# Patient Record
Sex: Female | Born: 1990 | Race: White | Hispanic: No | Marital: Single | Smoking: Never smoker
Health system: Southern US, Community
[De-identification: ages and names within clinical notes are randomized; demographics above are authoritative.]

## PROBLEM LIST (undated history)

## (undated) DIAGNOSIS — D649 Anemia, unspecified: Secondary | ICD-10-CM

## (undated) HISTORY — PX: WISDOM TOOTH EXTRACTION: SHX21

---

## 2018-01-11 ENCOUNTER — Encounter (HOSPITAL_COMMUNITY): Payer: Self-pay

## 2018-01-11 ENCOUNTER — Emergency Department (HOSPITAL_COMMUNITY)
Admission: EM | Admit: 2018-01-11 | Discharge: 2018-01-11 | Disposition: A | Discharge status: Home / Self Care | Payer: Self-pay | Attending: Emergency Medicine | Admitting: Emergency Medicine

## 2018-01-11 ENCOUNTER — Other Ambulatory Visit: Payer: Self-pay

## 2018-01-11 DIAGNOSIS — R51 Headache: Secondary | ICD-10-CM | POA: Insufficient documentation

## 2018-01-11 DIAGNOSIS — A084 Viral intestinal infection, unspecified: Secondary | ICD-10-CM | POA: Insufficient documentation

## 2018-01-11 LAB — CBC
HEMATOCRIT: 41.9 % (ref 36.0–46.0)
Hemoglobin: 13.8 g/dL (ref 12.0–15.0)
MCH: 29.3 pg (ref 26.0–34.0)
MCHC: 32.9 g/dL (ref 30.0–36.0)
MCV: 89 fL (ref 78.0–100.0)
PLATELETS: 218 10*3/uL (ref 150–400)
RBC: 4.71 MIL/uL (ref 3.87–5.11)
RDW: 13 % (ref 11.5–15.5)
WBC: 7.4 10*3/uL (ref 4.0–10.5)

## 2018-01-11 LAB — URINALYSIS, ROUTINE W REFLEX MICROSCOPIC
Bilirubin Urine: NEGATIVE
Glucose, UA: NEGATIVE mg/dL
Ketones, ur: 80 mg/dL — AB
Leukocytes, UA: NEGATIVE
NITRITE: NEGATIVE
PH: 5 (ref 5.0–8.0)
Protein, ur: 100 mg/dL — AB
RBC / HPF: >50 RBC/hpf — ABNORMAL HIGH (ref 0–5)
SPECIFIC GRAVITY, URINE: 1.029 (ref 1.005–1.030)

## 2018-01-11 LAB — COMPREHENSIVE METABOLIC PANEL
ALBUMIN: 4.3 g/dL (ref 3.5–5.0)
ALT: 27 U/L (ref 14–54)
AST: 28 U/L (ref 15–41)
Alkaline Phosphatase: 83 U/L (ref 38–126)
Anion gap: 11 (ref 5–15)
BILIRUBIN TOTAL: 0.7 mg/dL (ref 0.3–1.2)
BUN: 11 mg/dL (ref 6–20)
CALCIUM: 9.2 mg/dL (ref 8.9–10.3)
CO2: 24 mmol/L (ref 22–32)
CREATININE: 0.83 mg/dL (ref 0.44–1.00)
Chloride: 101 mmol/L (ref 101–111)
GFR calc Af Amer: >60 mL/min (ref >60)
GFR calc non Af Amer: >60 mL/min (ref >60)
GLUCOSE: 93 mg/dL (ref 65–99)
Potassium: 3.9 mmol/L (ref 3.5–5.1)
Sodium: 136 mmol/L (ref 135–145)
TOTAL PROTEIN: 7.9 g/dL (ref 6.5–8.1)

## 2018-01-11 LAB — LIPASE, BLOOD: Lipase: 26 U/L (ref 11–51)

## 2018-01-11 LAB — I-STAT BETA HCG BLOOD, ED (MC, WL, AP ONLY): I-stat hCG, quantitative: <5 m[IU]/mL (ref <5)

## 2018-01-11 MED ORDER — ONDANSETRON HCL 4 MG PO TABS: 4.0000 mg | ORAL_TABLET | Freq: Four times a day (QID) | ORAL | 0 refills | Status: DC

## 2018-01-11 MED ORDER — SODIUM CHLORIDE 0.9 % IV BOLUS
1000.0000 mL | Freq: Once | INTRAVENOUS | Status: AC
  Administered 2018-01-11: 1000 mL via INTRAVENOUS

## 2018-01-11 MED ORDER — ACETAMINOPHEN 325 MG PO TABS
650.0000 mg | ORAL_TABLET | Freq: Once | ORAL | Status: AC
Start: 2018-01-11 — End: 2018-01-11
  Administered 2018-01-11: 650 mg via ORAL
  Filled 2018-01-11: qty 2

## 2018-01-11 MED ORDER — ONDANSETRON HCL 4 MG/2ML IJ SOLN
4.0000 mg | Freq: Once | INTRAMUSCULAR | Status: AC
  Administered 2018-01-11: 4 mg via INTRAVENOUS
  Filled 2018-01-11: qty 2

## 2018-01-11 MED ORDER — ONDANSETRON 4 MG PO TBDP: 4.0000 mg | ORAL_TABLET | Freq: Once | ORAL | Status: DC | PRN

## 2018-01-11 NOTE — ED Triage Notes (Signed)
Pt states she has been vomiting for 2 days, denies abdominal pain. Has implant and denies pregnancy. Pt reports some headache as well.

## 2018-01-11 NOTE — ED Provider Notes (Addendum)
[facility] EMERGENCY DEPARTMENT Provider Note   CSN: [id number] Arrival date & time: 01/11/18  1149     History   Chief Complaint Chief Complaint  Patient presents with  . Emesis    HPI Carrie Cooper is a 27 y.o. female.  HPI   27 year old female with no significant past medical history.  Presents today with complaints of nausea vomiting.  Patient notes symptoms started yesterday with several episodes of nonbloody vomit.  She notes she had one bowel movement yesterday that was very loose, no hematochezia.  Patient notes no associated abdominal pain, cough, fever, rashes, dysuria, vaginal discharge.  She notes she is currently on her menstrual cycle.  Patient denies any close sick contacts, she notes she did find a tick on her upon wakening 2 days ago, she notes this was likely on less than 12 hours, no rash.  Patient does note a minor associated headache, nonfocal, denies any neurological deficits.  She denies any neck stiffness.  History reviewed. No pertinent past medical history.  There are no active problems to display for this patient.   History reviewed. No pertinent surgical history.   OB History   None      Home Medications    Prior to Admission medications   Medication Sig Start Date End Date Taking? Authorizing Provider  ondansetron (ZOFRAN) 4 MG tablet Take 1 tablet (4 mg total) by mouth every 6 (six) hours. 01/11/18   Eyvonne Mechanic, PA-C    Family History History reviewed. No pertinent family history.  Social History Social History   Tobacco Use  . Smoking status: Never Smoker  . Smokeless tobacco: Never Used  Substance Use Topics  . Alcohol use: Never    Frequency: Never  . Drug use: Never     Allergies   Patient has no known allergies.   Review of Systems Review of Systems  All other systems reviewed and are negative.    Physical Exam Updated Vital Signs BP 101/75 (BP Location: Right Arm)   Pulse 88   Temp (!)  100.6 F (38.1 C) (Oral)   Resp 14   SpO2 100%   Physical Exam  Constitutional: She is oriented to person, place, and time. She appears well-developed and well-nourished.  HENT:  Head: Normocephalic and atraumatic.  Eyes: Pupils are equal, round, and reactive to light. Conjunctivae are normal. Right eye exhibits no discharge. Left eye exhibits no discharge. No scleral icterus.  Neck: Normal range of motion. No JVD present. No tracheal deviation present.  Pulmonary/Chest: Effort normal and breath sounds normal. No stridor. No respiratory distress. She has no wheezes. She has no rales. She exhibits no tenderness.  Abdominal: Soft. She exhibits no distension and no mass. There is no tenderness. There is no rebound and no guarding. No hernia.  Neurological: She is alert and oriented to person, place, and time. Coordination normal.  Skin:  No rashes - minor irritation to left gluteal fold no erythema   Psychiatric: She has a normal mood and affect. Her behavior is normal. Judgment and thought content normal.  Nursing note and vitals reviewed.    ED Treatments / Results  Labs (all labs ordered are listed, but only abnormal results are displayed) Labs Reviewed  URINALYSIS, ROUTINE W REFLEX MICROSCOPIC - Abnormal; Notable for the following components:      Result Value   APPearance CLOUDY (*)    Hgb urine dipstick LARGE (*)    Ketones, ur 80 (*)    Protein,  ur 100 (*)    RBC / HPF >50 (*)    Bacteria, UA RARE (*)    All other components within normal limits  LIPASE, BLOOD  COMPREHENSIVE METABOLIC PANEL  CBC  I-STAT BETA HCG BLOOD, ED ([facility], [facility], [facility] ONLY)    EKG None  Radiology No results found.  Procedures Procedures (including critical care time)  Medications Ordered in ED Medications  ondansetron (ZOFRAN-ODT) disintegrating tablet 4 mg (has no administration in time range)  sodium chloride 0.9 % bolus 1,000 mL (1,000 mLs Intravenous New Bag/Given 01/11/18 1653)  ondansetron  (ZOFRAN) injection 4 mg (4 mg Intravenous Given 01/11/18 1653)  acetaminophen (TYLENOL) tablet 650 mg (650 mg Oral Given 01/11/18 1652)     Initial Impression / Assessment and Plan / ED Course  I have reviewed the triage vital signs and the nursing notes.  Pertinent labs & imaging results that were available during my care of the patient were reviewed by me and considered in my medical decision making (see chart for details).      Final Clinical Impressions(s) / ED Diagnoses   Final diagnoses:  Viral gastroenteritis    Labs: Urinalysis, i-STAT beta-hCG, lipase, CMP, CBC  Imaging:  Consults:  Therapeutics: Drenda Freeze, normal saline  Discharge Meds: Zofran  Assessment/Plan: 27 year old female presents today with likely viral gastroenteritis.  Patient well-appearing in no acute distress, soft nontender abdomen.  She originally had a generalized headache that resolved with fluids and Tylenol, no signs of significant infectious etiology including meningitis, appendicitis, diverticulitis or any intra-abdominal pathology.  Patient tolerating p.o. fever resolved with Tylenol.  She discharged home with Zofran, she will return in 2 days if symptoms persist immediately if they worsen.  She verbalized understanding and agreement to this plan had no further questions or concerns at time of discharge.   ED Discharge Orders        Ordered    ondansetron (ZOFRAN) 4 MG tablet  Every 6 hours     01/11/18 1831          Eyvonne Mechanic, PA-C 01/11/18 1833    Tegeler, Canary Brim, MD 01/11/18 [phone]

## 2018-01-11 NOTE — Discharge Instructions (Addendum)
Please read attached information. If you experience any new or worsening signs or symptoms please return to the emergency room for evaluation. Please follow-up with your primary care provider or specialist as discussed. Please use medication prescribed only as directed and discontinue taking if you have any concerning signs or symptoms.   °

## 2018-09-07 NOTE — L&D Delivery Note (Signed)
Delivery Note  At 1:32 PM a viable female, named Carrie Cooper, was delivered via Vaginal, Spontaneous (Presentation: LOA ).  APGAR: 8, 9; weight pending Placenta status:spontaneous, complete with 3 Vx Cord.  2 loose nuchal cord loops easily reduced  Anesthesia:  epidural Episiotomy: None Lacerations: Vaginal Suture Repair: 3.0 Monocryl Est. Blood Loss (mL): 424  Mom to postpartum.  Baby to Couplet care / Skin to Skin. Breastfeeding Outpatient circumcision  Dede Query Hawk Mones 07/03/2019, 2:31 PM

## 2018-12-13 DIAGNOSIS — R87610 Atypical squamous cells of undetermined significance on cytologic smear of cervix (ASC-US): Secondary | ICD-10-CM | POA: Diagnosis not present

## 2018-12-13 DIAGNOSIS — Z3A11 11 weeks gestation of pregnancy: Secondary | ICD-10-CM | POA: Diagnosis not present

## 2018-12-13 DIAGNOSIS — N925 Other specified irregular menstruation: Secondary | ICD-10-CM | POA: Diagnosis not present

## 2018-12-13 DIAGNOSIS — Z124 Encounter for screening for malignant neoplasm of cervix: Secondary | ICD-10-CM | POA: Diagnosis not present

## 2018-12-13 DIAGNOSIS — O3680X9 Pregnancy with inconclusive fetal viability, other fetus: Secondary | ICD-10-CM | POA: Diagnosis not present

## 2018-12-13 DIAGNOSIS — R112 Nausea with vomiting, unspecified: Secondary | ICD-10-CM | POA: Diagnosis not present

## 2019-01-24 DIAGNOSIS — Z3482 Encounter for supervision of other normal pregnancy, second trimester: Secondary | ICD-10-CM | POA: Diagnosis not present

## 2019-02-14 DIAGNOSIS — Z363 Encounter for antenatal screening for malformations: Secondary | ICD-10-CM | POA: Diagnosis not present

## 2019-02-14 DIAGNOSIS — Z3A20 20 weeks gestation of pregnancy: Secondary | ICD-10-CM | POA: Diagnosis not present

## 2019-02-14 DIAGNOSIS — Z3402 Encounter for supervision of normal first pregnancy, second trimester: Secondary | ICD-10-CM | POA: Diagnosis not present

## 2019-03-14 DIAGNOSIS — R252 Cramp and spasm: Secondary | ICD-10-CM | POA: Diagnosis not present

## 2019-03-14 DIAGNOSIS — Z3492 Encounter for supervision of normal pregnancy, unspecified, second trimester: Secondary | ICD-10-CM | POA: Diagnosis not present

## 2019-03-14 DIAGNOSIS — L292 Pruritus vulvae: Secondary | ICD-10-CM | POA: Diagnosis not present

## 2019-03-14 DIAGNOSIS — Z3A24 24 weeks gestation of pregnancy: Secondary | ICD-10-CM | POA: Diagnosis not present

## 2019-05-10 DIAGNOSIS — K219 Gastro-esophageal reflux disease without esophagitis: Secondary | ICD-10-CM | POA: Diagnosis not present

## 2019-05-10 DIAGNOSIS — Z23 Encounter for immunization: Secondary | ICD-10-CM | POA: Diagnosis not present

## 2019-05-10 DIAGNOSIS — Z3493 Encounter for supervision of normal pregnancy, unspecified, third trimester: Secondary | ICD-10-CM | POA: Diagnosis not present

## 2019-05-10 DIAGNOSIS — Z3A32 32 weeks gestation of pregnancy: Secondary | ICD-10-CM | POA: Diagnosis not present

## 2019-05-10 DIAGNOSIS — O26849 Uterine size-date discrepancy, unspecified trimester: Secondary | ICD-10-CM | POA: Diagnosis not present

## 2019-06-08 DIAGNOSIS — Z3A37 37 weeks gestation of pregnancy: Secondary | ICD-10-CM | POA: Diagnosis not present

## 2019-06-08 DIAGNOSIS — O365999 Maternal care for other known or suspected poor fetal growth, unspecified trimester, other fetus: Secondary | ICD-10-CM | POA: Diagnosis not present

## 2019-06-08 DIAGNOSIS — Z113 Encounter for screening for infections with a predominantly sexual mode of transmission: Secondary | ICD-10-CM | POA: Diagnosis not present

## 2019-06-08 DIAGNOSIS — Z3403 Encounter for supervision of normal first pregnancy, third trimester: Secondary | ICD-10-CM | POA: Diagnosis not present

## 2019-06-08 LAB — OB RESULTS CONSOLE GBS: GBS: NEGATIVE

## 2019-06-30 ENCOUNTER — Telehealth (HOSPITAL_COMMUNITY): Payer: Self-pay | Admitting: *Deleted

## 2019-06-30 NOTE — Telephone Encounter (Signed)
Preadmission screen  

## 2019-07-03 ENCOUNTER — Encounter (HOSPITAL_COMMUNITY): Payer: Self-pay

## 2019-07-03 ENCOUNTER — Inpatient Hospital Stay (HOSPITAL_COMMUNITY): Payer: Medicaid Other | Admitting: Anesthesiology

## 2019-07-03 ENCOUNTER — Inpatient Hospital Stay (HOSPITAL_COMMUNITY)
Admission: AD | Admit: 2019-07-03 | Discharge: 2019-07-04 | DRG: 807 | Disposition: A | Discharge status: Home / Self Care | Payer: Medicaid Other | Attending: Obstetrics and Gynecology | Admitting: Obstetrics and Gynecology

## 2019-07-03 ENCOUNTER — Other Ambulatory Visit: Payer: Self-pay

## 2019-07-03 DIAGNOSIS — E669 Obesity, unspecified: Secondary | ICD-10-CM | POA: Diagnosis present

## 2019-07-03 DIAGNOSIS — O9902 Anemia complicating childbirth: Secondary | ICD-10-CM | POA: Diagnosis not present

## 2019-07-03 DIAGNOSIS — O99214 Obesity complicating childbirth: Secondary | ICD-10-CM | POA: Diagnosis present

## 2019-07-03 DIAGNOSIS — Z3A40 40 weeks gestation of pregnancy: Secondary | ICD-10-CM | POA: Diagnosis not present

## 2019-07-03 DIAGNOSIS — Z20828 Contact with and (suspected) exposure to other viral communicable diseases: Secondary | ICD-10-CM | POA: Diagnosis present

## 2019-07-03 DIAGNOSIS — D649 Anemia, unspecified: Secondary | ICD-10-CM | POA: Diagnosis not present

## 2019-07-03 DIAGNOSIS — O26893 Other specified pregnancy related conditions, third trimester: Secondary | ICD-10-CM | POA: Diagnosis present

## 2019-07-03 HISTORY — DX: Anemia, unspecified: D64.9

## 2019-07-03 LAB — CBC
HCT: 31.6 % — ABNORMAL LOW (ref 36.0–46.0)
Hemoglobin: 10.1 g/dL — ABNORMAL LOW (ref 12.0–15.0)
MCH: 27.6 pg (ref 26.0–34.0)
MCHC: 32 g/dL (ref 30.0–36.0)
MCV: 86.3 fL (ref 80.0–100.0)
Platelets: 279 10*3/uL (ref 150–400)
RBC: 3.66 MIL/uL — ABNORMAL LOW (ref 3.87–5.11)
RDW: 13.9 % (ref 11.5–15.5)
WBC: 8.7 10*3/uL (ref 4.0–10.5)
nRBC: 0 % (ref 0.0–0.2)

## 2019-07-03 LAB — TYPE AND SCREEN
ABO/RH(D): A POS
Antibody Screen: NEGATIVE

## 2019-07-03 LAB — RPR: RPR Ser Ql: NONREACTIVE

## 2019-07-03 LAB — SARS CORONAVIRUS 2 BY RT PCR (HOSPITAL ORDER, PERFORMED IN ~~LOC~~ HOSPITAL LAB): SARS Coronavirus 2: NEGATIVE

## 2019-07-03 LAB — ABO/RH: ABO/RH(D): A POS

## 2019-07-03 MED ORDER — WITCH HAZEL-GLYCERIN EX PADS: 1.0000 "application " | MEDICATED_PAD | CUTANEOUS | Status: DC | PRN

## 2019-07-03 MED ORDER — SOD CITRATE-CITRIC ACID 500-334 MG/5ML PO SOLN: 30.0000 mL | ORAL | Status: DC | PRN

## 2019-07-03 MED ORDER — OXYTOCIN 40 UNITS IN NORMAL SALINE INFUSION - SIMPLE MED
2.5000 [IU]/h | INTRAVENOUS | Status: DC
  Administered 2019-07-03: 2.5 [IU]/h via INTRAVENOUS
  Filled 2019-07-03: qty 1000

## 2019-07-03 MED ORDER — SENNOSIDES-DOCUSATE SODIUM 8.6-50 MG PO TABS
2.0000 | ORAL_TABLET | ORAL | Status: DC
  Administered 2019-07-04: 01:00:00 2 via ORAL
  Filled 2019-07-03: qty 2

## 2019-07-03 MED ORDER — PRENATAL MULTIVITAMIN CH
1.0000 | ORAL_TABLET | Freq: Every day | ORAL | Status: DC
  Administered 2019-07-04: 11:00:00 1 via ORAL
  Filled 2019-07-03: qty 1

## 2019-07-03 MED ORDER — ONDANSETRON HCL 4 MG/2ML IJ SOLN: 4.0000 mg | INTRAMUSCULAR | Status: DC | PRN

## 2019-07-03 MED ORDER — ONDANSETRON HCL 4 MG/2ML IJ SOLN: 4.0000 mg | Freq: Four times a day (QID) | INTRAMUSCULAR | Status: DC | PRN

## 2019-07-03 MED ORDER — LACTATED RINGERS IV SOLN: 500.0000 mL | Freq: Once | INTRAVENOUS | Status: DC

## 2019-07-03 MED ORDER — LACTATED RINGERS IV SOLN: 500.0000 mL | INTRAVENOUS | Status: DC | PRN

## 2019-07-03 MED ORDER — PHENYLEPHRINE 40 MCG/ML (10ML) SYRINGE FOR IV PUSH (FOR BLOOD PRESSURE SUPPORT)
80.0000 ug | PREFILLED_SYRINGE | INTRAVENOUS | Status: DC | PRN
  Filled 2019-07-03: qty 10

## 2019-07-03 MED ORDER — ACETAMINOPHEN 325 MG PO TABS: 650.0000 mg | ORAL_TABLET | ORAL | Status: DC | PRN

## 2019-07-03 MED ORDER — LACTATED RINGERS IV SOLN
500.0000 mL | Freq: Once | INTRAVENOUS | Status: AC
  Administered 2019-07-03: 500 mL via INTRAVENOUS

## 2019-07-03 MED ORDER — EPHEDRINE 5 MG/ML INJ: 10.0000 mg | INTRAVENOUS | Status: DC | PRN

## 2019-07-03 MED ORDER — ONDANSETRON HCL 4 MG PO TABS: 4.0000 mg | ORAL_TABLET | ORAL | Status: DC | PRN

## 2019-07-03 MED ORDER — FENTANYL CITRATE (PF) 100 MCG/2ML IJ SOLN: 50.0000 ug | INTRAMUSCULAR | Status: DC | PRN

## 2019-07-03 MED ORDER — COCONUT OIL OIL
1.0000 "application " | TOPICAL_OIL | Status: DC | PRN
  Administered 2019-07-04: 1 via TOPICAL

## 2019-07-03 MED ORDER — BENZOCAINE-MENTHOL 20-0.5 % EX AERO: 1.0000 "application " | INHALATION_SPRAY | CUTANEOUS | Status: DC | PRN

## 2019-07-03 MED ORDER — LACTATED RINGERS IV SOLN
INTRAVENOUS | Status: DC
  Administered 2019-07-03 (×2): via INTRAVENOUS

## 2019-07-03 MED ORDER — DIPHENHYDRAMINE HCL 25 MG PO CAPS: 25.0000 mg | ORAL_CAPSULE | Freq: Four times a day (QID) | ORAL | Status: DC | PRN

## 2019-07-03 MED ORDER — FENTANYL-BUPIVACAINE-NACL 0.5-0.125-0.9 MG/250ML-% EP SOLN
12.0000 mL/h | EPIDURAL | Status: DC | PRN
  Filled 2019-07-03: qty 250

## 2019-07-03 MED ORDER — LIDOCAINE HCL (PF) 1 % IJ SOLN
INTRAMUSCULAR | Status: DC | PRN
  Administered 2019-07-03 (×2): 4 mL via EPIDURAL

## 2019-07-03 MED ORDER — SODIUM CHLORIDE (PF) 0.9 % IJ SOLN
INTRAMUSCULAR | Status: DC | PRN
  Administered 2019-07-03: 12 mL/h via EPIDURAL

## 2019-07-03 MED ORDER — ZOLPIDEM TARTRATE 5 MG PO TABS: 5.0000 mg | ORAL_TABLET | Freq: Every evening | ORAL | Status: DC | PRN

## 2019-07-03 MED ORDER — TETANUS-DIPHTH-ACELL PERTUSSIS 5-2.5-18.5 LF-MCG/0.5 IM SUSP: 0.5000 mL | Freq: Once | INTRAMUSCULAR | Status: DC

## 2019-07-03 MED ORDER — DIPHENHYDRAMINE HCL 50 MG/ML IJ SOLN: 12.5000 mg | INTRAMUSCULAR | Status: DC | PRN

## 2019-07-03 MED ORDER — OXYTOCIN BOLUS FROM INFUSION
500.0000 mL | Freq: Once | INTRAVENOUS | Status: AC
  Administered 2019-07-03: 14:00:00 500 mL via INTRAVENOUS

## 2019-07-03 MED ORDER — DIBUCAINE (PERIANAL) 1 % EX OINT: 1.0000 "application " | TOPICAL_OINTMENT | CUTANEOUS | Status: DC | PRN

## 2019-07-03 MED ORDER — IBUPROFEN 600 MG PO TABS
600.0000 mg | ORAL_TABLET | Freq: Four times a day (QID) | ORAL | Status: DC
  Administered 2019-07-03 – 2019-07-04 (×5): 600 mg via ORAL
  Filled 2019-07-03 (×5): qty 1

## 2019-07-03 MED ORDER — SIMETHICONE 80 MG PO CHEW: 80.0000 mg | CHEWABLE_TABLET | ORAL | Status: DC | PRN

## 2019-07-03 MED ORDER — LIDOCAINE HCL (PF) 1 % IJ SOLN: 30.0000 mL | INTRAMUSCULAR | Status: DC | PRN

## 2019-07-03 MED ORDER — PHENYLEPHRINE 40 MCG/ML (10ML) SYRINGE FOR IV PUSH (FOR BLOOD PRESSURE SUPPORT): 80.0000 ug | PREFILLED_SYRINGE | INTRAVENOUS | Status: DC | PRN

## 2019-07-03 MED ORDER — FERROUS SULFATE 325 (65 FE) MG PO TABS
325.0000 mg | ORAL_TABLET | Freq: Two times a day (BID) | ORAL | Status: DC
  Administered 2019-07-03 – 2019-07-04 (×3): 325 mg via ORAL
  Filled 2019-07-03 (×3): qty 1

## 2019-07-03 MED ORDER — MEASLES, MUMPS & RUBELLA VAC IJ SOLR: 0.5000 mL | Freq: Once | INTRAMUSCULAR | Status: DC

## 2019-07-03 NOTE — H&P (Signed)
OB ADMISSION/ HISTORY & PHYSICAL:  Admission Date: 07/03/2019  4:23 AM  Admit Diagnosis: Normal labor  Carrie Cooper is a 28 y.o. female presenting for ctx that began @ 0300. Endorses active FM, denies LOF and vaginal bleeding.   Prenatal History: G1P0   EDC : 06/29/2019, by  LMP and congruent w/ [redacted]w[redacted]d U/S Prenatal care at [facility]  Prenatal course complicated by anemia  Patient Active Problem List   Diagnosis Date Noted  . Normal labor 07/03/2019    Prenatal Labs: ABO, Rh: --/--/A POS (10/26 1610) Antibody: NEG (10/26 0545) Rubella:  Immune  RPR:  Non-reactive HBsAg:  Negative  HIV:   Non-reactive GTT: passed GBS: Negative/-- (10/01 0000)  GC/CHL: negative Genetics: Low-risk female AFP: Normal Anat U/S: Complete U/S @ [redacted]w[redacted]d for lagging FL-Vertex, posterior right placenta, AFI 20.8, EFW 3049g (6#12oz) 52%tile, FL lagging <2%  OB History  Gravida Para Term Preterm AB Living  1            SAB TAB Ectopic Multiple Live Births               # Outcome Date GA Lbr Len/2nd Weight Sex Delivery Anes PTL Lv  1 Current             Medical / Surgical History: Past medical history:  Past Medical History:  Diagnosis Date  . Anemia     Past surgical history:  Past Surgical History:  Procedure Laterality Date  . WISDOM TOOTH EXTRACTION     no sedation due to being done with preg   Family History:  Family History  Problem Relation Age of Onset  . Cancer Mother     Social History:  reports that she has never smoked. She has never used smokeless tobacco. She reports that she does not drink alcohol or use drugs.  Allergies: Patient has no known allergies.   Current Medications at time of admission:  Prior to Admission medications   Medication Sig Start Date End Date Taking? Authorizing Provider  ondansetron (ZOFRAN) 4 MG tablet Take 1 tablet (4 mg total) by mouth every 6 (six) hours. 01/11/18  Yes Hedges, Tinnie Gens, PA-C    Review of Systems: Constitutional:  Negative.   HENT: Negative.   Eyes: Negative.   Respiratory: Negative.   Cardiovascular: Negative.   Gastrointestinal:   Genitourinary: nEGATIVE for bloody show negative for ROM   Musculoskeletal: Negative.   Skin: Negative.   Neurological: Negative.   Endo/Heme/Allergies: Negative.   Psychiatric/Behavioral: Negative.    Physical Exam: VS: Blood pressure 118/87, pulse 83, temperature 98.2 F (36.8 C), temperature source Axillary, resp. rate 18, height 5\' 4"  (1.626 m), weight 83 kg, last menstrual period 09/22/2018, SpO2 100 %. AAO x3, no signs of distress Cardiovascular: RRR Respiratory: Lung fields clear with ausculation GU/GI: Abdomen gravid, non-tender, non-distended, active FM, vertex and 7#6oz per Leopold's Extremities: trace edema, negative for pain, tenderness, and cords  Cervical exam:Dilation: 6 Effacement (%): 90 Station: -2 Exam by:: katherine g jones rn FHR: baseline rate 135 / variability moderate / accelerations present / absent decelerations TOCO: 1.5-3/ mod per palpation   Prenatal Transfer Tool  Maternal Diabetes: No Genetic Screening: Normal Maternal Ultrasounds/Referrals: Other: Fetal Ultrasounds or other Referrals:  None Maternal Substance Abuse:  No Significant Maternal Medications:  None Significant Maternal Lab Results: Group B Strep negative    Assessment: 28 y.o. G1P0 [redacted]w[redacted]d Active labor FHR category 1 GBS negative Anemia     -PO Iron during preg     -  Hgb 10.1 on admission Pain management: Desires unmedicated childbirth   Plan:  Admit to L&D Routine admission orders Epidural PRN Active management of third stage to reduce PP bleeding  Dr Mancel Bale and Dr. Cletis Media notified of admission and plan of care  Arrie Eastern CNM, MSN 07/03/2019 8:00 AM

## 2019-07-03 NOTE — Progress Notes (Signed)
Derica Leiber is a 28 y.o. G1P0 at 9w4dadmitted for active labor  Subjective:  Comfortable with  Epidural Contractions every 2-4 minutes, lasting 40 seconds, palpating moderate    Objective: BP 126/87   Pulse 72   Temp 97.6 F (36.4 C) (Axillary)   Resp 18   Ht 5\' 4"  (1.626 m)   Wt 83 kg   LMP 09/22/2018   SpO2 98%   BMI 31.41 kg/m  No intake/output data recorded. No intake/output data recorded.  FHT: Category 1 SVE:   Dilation: 7 Effacement (%): 90 Station: -1 Exam by:: Dr. Cletis Media  Labs: Lab Results  Component Value Date   WBC 8.7 07/03/2019   HGB 10.1 (L) 07/03/2019   HCT 31.6 (L) 07/03/2019   MCV 86.3 07/03/2019   PLT 279 07/03/2019    Assessment / Plan: Spontaneous labor, progressing normally Fetal Wellbeing: reassuring Anticipated MOD:  NSVD  Katharine Look A Dane Bloch 07/03/2019, 11:03 AM

## 2019-07-03 NOTE — Lactation Note (Signed)
This note was copied from a baby's chart. Lactation Consultation Note  Patient Name: Carrie Cooper Today's Date: 07/03/2019 Reason for consult: Initial assessment;1st time breastfeeding;Term P1, 7 hour female infant. Mom feels breastfeeding is going well, she latched infant in L&D for 15 minutes and when entered another 15 minutes. This will be mom's third time breastfeeding infant. [place] notice mom had coconut oil on counter but not used it was given by Nurse. Infant had two stools and one void since birth, LC changed stool (meconium) while in room. Mom is active on the [facility] in [facility] and she has two hand pumps at home. LC entered room infant was cuing to breastfeed. LC discussed hand expression and mom taught back, colostrum is present in both breast. Mom latched infant on right breast using the cross cradle hold, LC ask mom express small amount colostrum out, tickle infant below nose with breast, wait until infant's mouth is wide, tongue down bring infant chin first to breast. Infant latched without difficulty, nose and chin touching breast, swallows observed, infant breastfeed for 26 minutes. Mom did STS with infant afterwards. Mom felt latch has improved only felt a tug but no pain with latch. Mom knows to breastfeed infant according hunger cues, 8 to 12 times within 24 hours and on demand. Parents will continue to do STS. Mom knows to call Nurse or [place] if she has any questions, concerns or need assistance with latching infant to breast. Mom made aware of O/P services, breastfeeding support groups, community resources, and our phone # for post-discharge questions.   Maternal Data Formula Feeding for Exclusion: No Has patient been taught Hand Expression?: Yes(Colostrum is present both breast.) Does the patient have breastfeeding experience prior to this delivery?: No  Feeding Feeding Type: Breast Fed  LATCH Score Latch: Grasps breast easily, tongue down, lips  flanged, rhythmical sucking.  Audible Swallowing: Spontaneous and intermittent  Type of Nipple: Everted at rest and after stimulation  Comfort (Breast/Nipple): Soft / non-tender  Hold (Positioning): Assistance needed to correctly position infant at breast and maintain latch.  LATCH Score: 9  Interventions Interventions: Breast feeding basics reviewed;Assisted with latch;Skin to skin;Breast massage;Hand express;Coconut oil;Expressed milk;Support pillows;Adjust position;Breast compression;Position options  Lactation Tools Discussed/Used WIC Program: Yes   Consult Status Consult Status: Follow-up Date: 07/04/19 Follow-up type: In-patient    Vicente Serene 07/03/2019, 8:54 PM

## 2019-07-03 NOTE — Anesthesia Procedure Notes (Signed)
Epidural Patient location during procedure: OB Start time: 07/03/2019 8:15 AM End time: 07/03/2019 8:23 AM  Staffing Anesthesiologist: Josephine Igo, MD Performed: anesthesiologist   Preanesthetic Checklist Completed: patient identified, site marked, surgical consent, pre-op evaluation, timeout performed, IV checked, risks and benefits discussed and monitors and equipment checked  Epidural Patient position: sitting Prep: site prepped and draped and DuraPrep Patient monitoring: continuous pulse ox and blood pressure Approach: midline Location: L3-L4 Injection technique: LOR air  Needle:  Needle type: Tuohy  Needle gauge: 17 G Needle length: 9 cm and 9 Needle insertion depth: 6 cm Catheter type: closed end flexible Catheter size: 19 Gauge Catheter at skin depth: 11 cm Test dose: negative and Other  Assessment Events: blood not aspirated, injection not painful, no injection resistance, negative IV test and no paresthesia  Additional Notes Patient identified. Risks and benefits discussed including failed block, incomplete  Pain control, post dural puncture headache, nerve damage, paralysis, blood pressure Changes, nausea, vomiting, reactions to medications-both toxic and allergic and post Partum back pain. All questions were answered. Patient expressed understanding and wished to proceed. Sterile technique was used throughout procedure. Epidural site was Dressed with sterile barrier dressing. No paresthesias, signs of intravascular injection Or signs of intrathecal spread were encountered.  Patient was more comfortable after the epidural was dosed. Please see RN's note for documentation of vital signs and FHR which are stable. Reason for block:procedure for pain

## 2019-07-03 NOTE — Anesthesia Preprocedure Evaluation (Signed)
Anesthesia Evaluation  Patient identified by MRN, date of birth, ID band Patient awake    Reviewed: Allergy & Precautions, H&P , Patient's Chart, lab work & pertinent test results  Airway Mallampati: II  TM Distance: >3 FB Neck ROM: full    Dental no notable dental hx. (+) Teeth Intact   Pulmonary neg pulmonary ROS,    Pulmonary exam normal breath sounds clear to auscultation       Cardiovascular negative cardio ROS Normal cardiovascular exam Rhythm:regular Rate:Normal     Neuro/Psych negative neurological ROS  negative psych ROS   GI/Hepatic negative GI ROS, Neg liver ROS,   Endo/Other  Obesity  Renal/GU negative Renal ROS  negative genitourinary   Musculoskeletal   Abdominal   Peds  Hematology  (+) Blood dyscrasia, anemia ,   Anesthesia Other Findings   Reproductive/Obstetrics (+) Pregnancy                             Anesthesia Physical Anesthesia Plan  ASA: II  Anesthesia Plan: Epidural   Post-op Pain Management:    Induction:   PONV Risk Score and Plan:   Airway Management Planned:   Additional Equipment:   Intra-op Plan:   Post-operative Plan:   Informed Consent: I have reviewed the patients History and Physical, chart, labs and discussed the procedure including the risks, benefits and alternatives for the proposed anesthesia with the patient or authorized representative who has indicated his/her understanding and acceptance.       Plan Discussed with: Anesthesiologist  Anesthesia Plan Comments:         Anesthesia Quick Evaluation  

## 2019-07-04 LAB — CBC
HCT: 29 % — ABNORMAL LOW (ref 36.0–46.0)
Hemoglobin: 9.4 g/dL — ABNORMAL LOW (ref 12.0–15.0)
MCH: 28.1 pg (ref 26.0–34.0)
MCHC: 32.4 g/dL (ref 30.0–36.0)
MCV: 86.6 fL (ref 80.0–100.0)
Platelets: 268 10*3/uL (ref 150–400)
RBC: 3.35 MIL/uL — ABNORMAL LOW (ref 3.87–5.11)
RDW: 14.2 % (ref 11.5–15.5)
WBC: 10.3 10*3/uL (ref 4.0–10.5)
nRBC: 0 % (ref 0.0–0.2)

## 2019-07-04 MED ORDER — IBUPROFEN 600 MG PO TABS: 600.0000 mg | ORAL_TABLET | Freq: Four times a day (QID) | ORAL | 0 refills | Status: DC

## 2019-07-04 NOTE — Lactation Note (Signed)
This note was copied from a baby's chart. Lactation Consultation Note:  Infant is 30 hours old. Mother plans discharge at 24 hours. Infant has had several short 5 mins feedings. Mother reports that she is unsure if infant is getting enough to eat and has questions about how she will know if infant is feeding well.  Mother reports that her nipples are tender.  Observed that mother has tiny blisters and very pink nipple's bilaterally.  Infant cuing and offered to assist with feeding. Mother very willing for assistance.   Infant placed STS in football hold with good pillow support.  Reviewed hand expression and observed tiny drops of colostrum.  Assist with latching infant on the left breast . Mother reports small amt of pain. Taught FOB to flange infants lips for wider gape.   Infant sustained latch for 15 mins. Observed steady rhythm of suckling and audible swallows. Parents very excited to hear swallowing. Mother taught to do breast compression .  Infant was then placed on the alternate breast in cross cradle hold. Infant sustained latch for 20 mins. Mothers nipple round when infant released the breast.  Reviewed cue base feeding and to feed infant 8-12 or more times in 24 hours.,Discussed cluster feeding and encouraged to continue STS when at home.  Discussed amts. of wet/dirty diapers and transitioning colors.   Mother was given a harmony hand pump. She is active with WIC. She doesn't have an electric pump at home. Mother fit with the #24 flange.  Mother was given comfort gels. She doesn't have a bra at the hospital but taught to use .   Mother would like to have a follow up with [place] LC. Informed mother of virtual support group and 24 /7 phone services.   Patient Name: Carrie Cooper Date: 07/04/2019 Reason for consult: Follow-up assessment   Maternal Data    Feeding Feeding Type: Breast Fed  LATCH Score Latch: Grasps breast easily, tongue down, lips flanged,  rhythmical sucking.  Audible Swallowing: Spontaneous and intermittent  Type of Nipple: Everted at rest and after stimulation  Comfort (Breast/Nipple): Filling, red/small blisters or bruises, mild/mod discomfort  Hold (Positioning): Assistance needed to correctly position infant at breast and maintain latch.  LATCH Score: 8  Interventions Interventions: Breast feeding basics reviewed;Assisted with latch;Skin to skin;Breast massage;Hand express;Pre-pump if needed;Breast compression;Support pillows;Position options;Comfort gels;Hand pump  Lactation Tools Discussed/Used     Consult Status Consult Status: Complete    Darla Lesches 07/04/2019, 11:27 AM

## 2019-07-04 NOTE — Discharge Summary (Signed)
OB Discharge Summary     Patient Name: Carrie Cooper DOB: [birth year] MRN: [id number]  Date of admission: 07/03/2019 Delivering MD: Silverio Lay   Date of discharge: 07/04/2019  Admitting diagnosis: 40.4WKS CTX Intrauterine pregnancy: [redacted]w[redacted]d     Secondary diagnosis:  Active Problems:   Normal labor   Anemia  Additional problems: None     Discharge diagnosis: Term Pregnancy Delivered                                                                                                Post partum procedures:None  Augmentation: None  Complications: None  Hospital course:  Onset of Labor With Vaginal Delivery     28 y.o. yo G1P1001 at [redacted]w[redacted]d was admitted in Active Labor on 07/03/2019. Patient had an uncomplicated labor course as follows:  Membrane Rupture Time/Date: 10:47 AM ,07/03/2019   Intrapartum Procedures: Episiotomy: None [1]                                         Lacerations:  Vaginal [6]  Patient had a delivery of a Viable infant. 07/03/2019  Information for the patient's newborn:  Burlene, [place] [[id number]]  Delivery Method: Vag-Spont     Pateint had an uncomplicated postpartum course.  She is ambulating, tolerating a regular diet, passing flatus, and urinating well. Patient is discharged home in stable condition on 07/04/19.   Physical exam  Vitals:   07/03/19 1630 07/03/19 1957 07/04/19 0038 07/04/19 0412  BP: 121/78 124/85 121/83 (!) 135/93  Pulse: 84 81 84 73  Resp: 18 18 18 18   Temp: 98.2 F (36.8 C) 98.2 F (36.8 C) 98.2 F (36.8 C) 98.3 F (36.8 C)  TempSrc: Oral Oral Oral Oral  SpO2: 98% 98% 97% 98%  Weight:      Height:       General: alert, cooperative and no distress Lochia: appropriate Uterine Fundus: firm Incision: N/A DVT Evaluation: No evidence of DVT seen on physical exam. Labs: Lab Results  Component Value Date   WBC 8.7 07/03/2019   HGB 10.1 (L) 07/03/2019   HCT 31.6 (L) 07/03/2019   MCV 86.3 07/03/2019   PLT 279  07/03/2019   CMP Latest Ref Rng & Units 01/11/2018  Glucose 65 - 99 mg/dL 93  BUN 6 - 20 mg/dL 11  Creatinine 03/13/2018 - 1.50 mg/dL 5.69  Sodium 7.94 - 801 mmol/L 136  Potassium 3.5 - 5.1 mmol/L 3.9  Chloride 101 - 111 mmol/L 101  CO2 22 - 32 mmol/L 24  Calcium 8.9 - 10.3 mg/dL 9.2  Total Protein 6.5 - 8.1 g/dL 7.9  Total Bilirubin 0.3 - 1.2 mg/dL 0.7  Alkaline Phos 38 - 126 U/L 83  AST 15 - 41 U/L 28  ALT 14 - 54 U/L 27    Discharge instruction: per After Visit Summary and "Baby and Me Booklet".  After visit meds:  Ibuprofen, PNV  Diet: routine diet  Activity: Advance as tolerated. Pelvic rest for 6 weeks.  Outpatient follow up:6 weeks Follow up Appt:No future appointments. Follow up Visit:No follow-ups on file.  Postpartum contraception: Depo Provera  Newborn Data: Live born female  Birth Weight: 7 lb 15.2 oz (3605 g) APGAR: 8, 9  Newborn Delivery   Birth date/time: 07/03/2019 13:32:00 Delivery type: Vaginal, Spontaneous      Baby Feeding: Breast Disposition:home with mother   07/04/2019 Starla Link, CNM

## 2019-07-04 NOTE — Anesthesia Postprocedure Evaluation (Signed)
Anesthesia Post Note  Patient: Carrie Cooper  Procedure(s) Performed: AN AD HOC LABOR EPIDURAL     Patient location during evaluation: Mother Baby Anesthesia Type: Epidural Level of consciousness: awake and alert Pain management: pain level controlled Vital Signs Assessment: post-procedure vital signs reviewed and stable Respiratory status: spontaneous breathing, nonlabored ventilation and respiratory function stable Cardiovascular status: stable Postop Assessment: no headache, no backache and epidural receding Anesthetic complications: no    Last Vitals:  Vitals:   07/04/19 0038 07/04/19 0412  BP: 121/83 (!) 135/93  Pulse: 84 73  Resp: 18 18  Temp: 36.8 C 36.8 C  SpO2: 97% 98%    Last Pain:  Vitals:   07/04/19 0412  TempSrc: Oral  PainSc:    Pain Goal:                   Bionca Mckey

## 2019-07-06 ENCOUNTER — Inpatient Hospital Stay (HOSPITAL_COMMUNITY)
Admission: AD | Admit: 2019-07-06 | Payer: Medicaid Other | Source: Home / Self Care | Admitting: Obstetrics & Gynecology

## 2019-07-06 ENCOUNTER — Inpatient Hospital Stay (HOSPITAL_COMMUNITY): Payer: Medicaid Other

## 2019-08-14 DIAGNOSIS — N898 Other specified noninflammatory disorders of vagina: Secondary | ICD-10-CM | POA: Diagnosis not present

## 2019-08-14 DIAGNOSIS — N760 Acute vaginitis: Secondary | ICD-10-CM | POA: Diagnosis not present

## 2019-08-14 DIAGNOSIS — L02419 Cutaneous abscess of limb, unspecified: Secondary | ICD-10-CM | POA: Diagnosis not present

## 2020-02-29 DIAGNOSIS — R05 Cough: Secondary | ICD-10-CM | POA: Diagnosis not present

## 2020-02-29 DIAGNOSIS — Z6825 Body mass index (BMI) 25.0-25.9, adult: Secondary | ICD-10-CM | POA: Diagnosis not present

## 2020-02-29 DIAGNOSIS — Z20828 Contact with and (suspected) exposure to other viral communicable diseases: Secondary | ICD-10-CM | POA: Diagnosis not present

## 2020-02-29 DIAGNOSIS — U071 COVID-19: Secondary | ICD-10-CM | POA: Diagnosis not present

## 2020-03-25 DIAGNOSIS — Z3A10 10 weeks gestation of pregnancy: Secondary | ICD-10-CM | POA: Diagnosis not present

## 2020-03-25 DIAGNOSIS — O3680X9 Pregnancy with inconclusive fetal viability, other fetus: Secondary | ICD-10-CM | POA: Diagnosis not present

## 2020-03-26 DIAGNOSIS — Z369 Encounter for antenatal screening, unspecified: Secondary | ICD-10-CM | POA: Diagnosis not present

## 2020-03-26 DIAGNOSIS — Z3493 Encounter for supervision of normal pregnancy, unspecified, third trimester: Secondary | ICD-10-CM | POA: Diagnosis not present

## 2020-03-26 DIAGNOSIS — Z315 Encounter for genetic counseling: Secondary | ICD-10-CM | POA: Diagnosis not present

## 2020-03-26 DIAGNOSIS — Z113 Encounter for screening for infections with a predominantly sexual mode of transmission: Secondary | ICD-10-CM | POA: Diagnosis not present

## 2020-03-26 DIAGNOSIS — Z3481 Encounter for supervision of other normal pregnancy, first trimester: Secondary | ICD-10-CM | POA: Diagnosis not present

## 2020-04-01 DIAGNOSIS — Z3493 Encounter for supervision of normal pregnancy, unspecified, third trimester: Secondary | ICD-10-CM | POA: Diagnosis not present

## 2020-04-01 LAB — OB RESULTS CONSOLE HEPATITIS B SURFACE ANTIGEN: Hepatitis B Surface Ag: NEGATIVE

## 2020-04-01 LAB — OB RESULTS CONSOLE RPR: RPR: NONREACTIVE

## 2020-04-01 LAB — OB RESULTS CONSOLE RUBELLA ANTIBODY, IGM: Rubella: IMMUNE

## 2020-05-20 ENCOUNTER — Other Ambulatory Visit: Payer: Self-pay | Admitting: Obstetrics and Gynecology

## 2020-05-20 DIAGNOSIS — Z3689 Encounter for other specified antenatal screening: Secondary | ICD-10-CM

## 2020-05-30 ENCOUNTER — Encounter: Payer: Self-pay | Admitting: *Deleted

## 2020-06-03 ENCOUNTER — Ambulatory Visit: Payer: Medicaid Other

## 2020-06-04 DIAGNOSIS — E860 Dehydration: Secondary | ICD-10-CM | POA: Diagnosis not present

## 2020-06-04 DIAGNOSIS — R110 Nausea: Secondary | ICD-10-CM | POA: Diagnosis not present

## 2020-06-13 ENCOUNTER — Other Ambulatory Visit: Payer: Self-pay | Admitting: Obstetrics and Gynecology

## 2020-06-13 ENCOUNTER — Ambulatory Visit: Payer: Medicaid Other | Attending: Obstetrics and Gynecology

## 2020-06-13 ENCOUNTER — Other Ambulatory Visit: Payer: Self-pay | Admitting: *Deleted

## 2020-06-13 ENCOUNTER — Other Ambulatory Visit: Payer: Self-pay

## 2020-06-13 DIAGNOSIS — Z362 Encounter for other antenatal screening follow-up: Secondary | ICD-10-CM

## 2020-06-13 DIAGNOSIS — Z3689 Encounter for other specified antenatal screening: Secondary | ICD-10-CM | POA: Insufficient documentation

## 2020-06-13 DIAGNOSIS — O30009 Twin pregnancy, unspecified number of placenta and unspecified number of amniotic sacs, unspecified trimester: Secondary | ICD-10-CM | POA: Diagnosis not present

## 2020-07-10 DIAGNOSIS — O358XX0 Maternal care for other (suspected) fetal abnormality and damage, not applicable or unspecified: Secondary | ICD-10-CM | POA: Diagnosis not present

## 2020-07-10 DIAGNOSIS — Z3A25 25 weeks gestation of pregnancy: Secondary | ICD-10-CM | POA: Diagnosis not present

## 2020-07-10 DIAGNOSIS — O09899 Supervision of other high risk pregnancies, unspecified trimester: Secondary | ICD-10-CM | POA: Diagnosis not present

## 2020-07-11 ENCOUNTER — Other Ambulatory Visit: Payer: Self-pay

## 2020-07-11 ENCOUNTER — Encounter: Payer: Self-pay | Admitting: *Deleted

## 2020-07-11 ENCOUNTER — Ambulatory Visit: Payer: Medicaid Other | Admitting: *Deleted

## 2020-07-11 ENCOUNTER — Other Ambulatory Visit: Payer: Self-pay | Admitting: *Deleted

## 2020-07-11 ENCOUNTER — Ambulatory Visit: Payer: Medicaid Other | Attending: Obstetrics and Gynecology

## 2020-07-11 VITALS — BP 125/71 | HR 104

## 2020-07-11 DIAGNOSIS — Z3A25 25 weeks gestation of pregnancy: Secondary | ICD-10-CM

## 2020-07-11 DIAGNOSIS — O09899 Supervision of other high risk pregnancies, unspecified trimester: Secondary | ICD-10-CM

## 2020-07-11 DIAGNOSIS — Z362 Encounter for other antenatal screening follow-up: Secondary | ICD-10-CM

## 2020-07-11 DIAGNOSIS — O09892 Supervision of other high risk pregnancies, second trimester: Secondary | ICD-10-CM | POA: Diagnosis not present

## 2020-07-11 DIAGNOSIS — O43192 Other malformation of placenta, second trimester: Secondary | ICD-10-CM | POA: Diagnosis not present

## 2020-07-23 DIAGNOSIS — R5383 Other fatigue: Secondary | ICD-10-CM | POA: Diagnosis not present

## 2020-07-23 DIAGNOSIS — Z369 Encounter for antenatal screening, unspecified: Secondary | ICD-10-CM | POA: Diagnosis not present

## 2020-08-15 ENCOUNTER — Encounter: Payer: Self-pay | Admitting: *Deleted

## 2020-08-15 ENCOUNTER — Other Ambulatory Visit: Payer: Self-pay | Admitting: *Deleted

## 2020-08-15 ENCOUNTER — Other Ambulatory Visit: Payer: Self-pay

## 2020-08-15 ENCOUNTER — Ambulatory Visit: Payer: Medicaid Other | Admitting: *Deleted

## 2020-08-15 ENCOUNTER — Ambulatory Visit: Payer: Medicaid Other | Attending: Obstetrics and Gynecology

## 2020-08-15 VITALS — BP 117/68 | HR 101

## 2020-08-15 DIAGNOSIS — Z3A30 30 weeks gestation of pregnancy: Secondary | ICD-10-CM

## 2020-08-15 DIAGNOSIS — O09892 Supervision of other high risk pregnancies, second trimester: Secondary | ICD-10-CM

## 2020-08-15 DIAGNOSIS — O09893 Supervision of other high risk pregnancies, third trimester: Secondary | ICD-10-CM

## 2020-08-15 DIAGNOSIS — O283 Abnormal ultrasonic finding on antenatal screening of mother: Secondary | ICD-10-CM

## 2020-08-15 DIAGNOSIS — Z362 Encounter for other antenatal screening follow-up: Secondary | ICD-10-CM | POA: Diagnosis not present

## 2020-08-19 DIAGNOSIS — Z3493 Encounter for supervision of normal pregnancy, unspecified, third trimester: Secondary | ICD-10-CM | POA: Diagnosis not present

## 2020-08-19 DIAGNOSIS — Z3A31 31 weeks gestation of pregnancy: Secondary | ICD-10-CM | POA: Diagnosis not present

## 2020-08-19 DIAGNOSIS — Z23 Encounter for immunization: Secondary | ICD-10-CM | POA: Diagnosis not present

## 2020-08-29 ENCOUNTER — Ambulatory Visit: Payer: Medicaid Other | Attending: Obstetrics and Gynecology | Admitting: *Deleted

## 2020-08-29 ENCOUNTER — Ambulatory Visit: Payer: Medicaid Other | Admitting: *Deleted

## 2020-08-29 ENCOUNTER — Other Ambulatory Visit: Payer: Self-pay

## 2020-08-29 ENCOUNTER — Encounter: Payer: Self-pay | Admitting: *Deleted

## 2020-08-29 VITALS — BP 121/72 | HR 110

## 2020-08-29 DIAGNOSIS — Z3A32 32 weeks gestation of pregnancy: Secondary | ICD-10-CM | POA: Insufficient documentation

## 2020-08-29 DIAGNOSIS — O09899 Supervision of other high risk pregnancies, unspecified trimester: Secondary | ICD-10-CM

## 2020-08-29 DIAGNOSIS — O409XX0 Polyhydramnios, unspecified trimester, not applicable or unspecified: Secondary | ICD-10-CM | POA: Diagnosis not present

## 2020-08-29 NOTE — Procedures (Signed)
Carrie Cooper 1991-04-06 [redacted]w[redacted]d  Fetus A Non-Stress Test Interpretation for 08/29/20  Indication: Polyhydramnios  Fetal Heart Rate A Mode: External Baseline Rate (A): 145 bpm Variability: Moderate Accelerations: 15 x 15 Decelerations: None Multiple birth?: No  Uterine Activity Mode: Palpation,Toco Contraction Frequency (min): U/I Contraction Duration (sec): 20-30 Contraction Quality: Mild Resting Tone Palpated: Relaxed Resting Time: Adequate  Interpretation (Fetal Testing) Nonstress Test Interpretation: Reactive Overall Impression: Reassuring for gestational age Comments: Reviewed EFM tracing with Dr. Parke Poisson

## 2020-09-04 ENCOUNTER — Other Ambulatory Visit: Payer: Self-pay

## 2020-09-04 ENCOUNTER — Encounter: Payer: Self-pay | Admitting: *Deleted

## 2020-09-04 ENCOUNTER — Ambulatory Visit: Payer: Medicaid Other | Attending: Obstetrics and Gynecology | Admitting: *Deleted

## 2020-09-04 ENCOUNTER — Ambulatory Visit: Payer: Medicaid Other | Admitting: *Deleted

## 2020-09-04 VITALS — BP 110/77 | HR 107

## 2020-09-04 DIAGNOSIS — O403XX0 Polyhydramnios, third trimester, not applicable or unspecified: Secondary | ICD-10-CM | POA: Insufficient documentation

## 2020-09-04 DIAGNOSIS — Z3A33 33 weeks gestation of pregnancy: Secondary | ICD-10-CM | POA: Insufficient documentation

## 2020-09-04 DIAGNOSIS — O09893 Supervision of other high risk pregnancies, third trimester: Secondary | ICD-10-CM

## 2020-09-04 DIAGNOSIS — O409XX0 Polyhydramnios, unspecified trimester, not applicable or unspecified: Secondary | ICD-10-CM

## 2020-09-04 NOTE — Procedures (Signed)
Carrie Cooper [birth year] [redacted]w[redacted]d  Fetus A Non-Stress Test Interpretation for 09/04/20  Indication: Polyhydramnios  Fetal Heart Rate A Mode: External Baseline Rate (A): 140 bpm Variability: Moderate Accelerations: 15 x 15 Decelerations: None Multiple birth?: No  Uterine Activity Mode: Palpation,Toco Contraction Frequency (min): none noted Resting Tone Palpated: Relaxed Resting Time: Adequate  Interpretation (Fetal Testing) Nonstress Test Interpretation: Reactive Comments: Reviewed tracing with Dr. Judeth Cornfield

## 2020-09-07 NOTE — L&D Delivery Note (Addendum)
Delivery Note Labor onset: 10/15/2020  Labor Onset Time:0300 Complete dilation at 8:34 AM  Onset of pushing at 0843 FHR second stage Cat 1 Analgesia/Anesthesia intrapartum: epidural  Guided pushing with maternal urge. Delivery of a viable female at 57. Fetal head delivered in OA position. Nuchal cord N/A. Infant placed on maternal abd, dried, and tactile stim. Spontaneous cry at delivery. Cord double clamped after and cut by FOB. 2 RNs present for birth. Cord blood sample collected    Placenta delivered Carrie Cooper per Carrie Cooper, intact, with 2 VC.  Placenta to pathology. 2VC.  Uterine tone firm @ u/2.  bleeding minimal  No laceration identified.  Anesthesia: epidural Repair: N/A QBL/EBL (mL): 50 Complications: N/A APGAR: APGAR (1 MIN):9    APGAR (5 MINS):  10 APGAR (10 MINS):   Mom to postpartum.  Baby to Couplet care / Skin to Skin.  Carrie Cooper Carrie Kidd MSN, CNM 10/15/2020, 9:08 AM

## 2020-09-12 ENCOUNTER — Other Ambulatory Visit: Payer: Self-pay

## 2020-09-12 ENCOUNTER — Ambulatory Visit: Payer: Medicaid Other | Admitting: *Deleted

## 2020-09-12 ENCOUNTER — Encounter: Payer: Self-pay | Admitting: *Deleted

## 2020-09-12 ENCOUNTER — Ambulatory Visit: Payer: Medicaid Other | Attending: Obstetrics and Gynecology

## 2020-09-12 VITALS — BP 120/71 | HR 102

## 2020-09-12 DIAGNOSIS — O283 Abnormal ultrasonic finding on antenatal screening of mother: Secondary | ICD-10-CM | POA: Diagnosis not present

## 2020-09-12 DIAGNOSIS — O403XX0 Polyhydramnios, third trimester, not applicable or unspecified: Secondary | ICD-10-CM

## 2020-09-12 DIAGNOSIS — O09892 Supervision of other high risk pregnancies, second trimester: Secondary | ICD-10-CM

## 2020-09-12 DIAGNOSIS — Z362 Encounter for other antenatal screening follow-up: Secondary | ICD-10-CM

## 2020-09-12 DIAGNOSIS — Z3A34 34 weeks gestation of pregnancy: Secondary | ICD-10-CM

## 2020-09-13 ENCOUNTER — Other Ambulatory Visit: Payer: Self-pay | Admitting: *Deleted

## 2020-09-13 DIAGNOSIS — O403XX0 Polyhydramnios, third trimester, not applicable or unspecified: Secondary | ICD-10-CM

## 2020-09-17 ENCOUNTER — Ambulatory Visit: Payer: Medicaid Other | Attending: Obstetrics and Gynecology

## 2020-09-17 ENCOUNTER — Encounter: Payer: Self-pay | Admitting: *Deleted

## 2020-09-17 ENCOUNTER — Other Ambulatory Visit: Payer: Self-pay

## 2020-09-17 ENCOUNTER — Ambulatory Visit: Payer: Medicaid Other | Admitting: *Deleted

## 2020-09-17 VITALS — BP 119/75 | HR 100

## 2020-09-17 DIAGNOSIS — O09892 Supervision of other high risk pregnancies, second trimester: Secondary | ICD-10-CM

## 2020-09-17 DIAGNOSIS — O403XX0 Polyhydramnios, third trimester, not applicable or unspecified: Secondary | ICD-10-CM

## 2020-09-17 DIAGNOSIS — Z3A35 35 weeks gestation of pregnancy: Secondary | ICD-10-CM | POA: Diagnosis not present

## 2020-09-17 DIAGNOSIS — Z362 Encounter for other antenatal screening follow-up: Secondary | ICD-10-CM

## 2020-09-17 DIAGNOSIS — O368930 Maternal care for other specified fetal problems, third trimester, not applicable or unspecified: Secondary | ICD-10-CM

## 2020-09-24 ENCOUNTER — Ambulatory Visit: Payer: Medicaid Other | Admitting: *Deleted

## 2020-09-24 ENCOUNTER — Other Ambulatory Visit: Payer: Self-pay

## 2020-09-24 ENCOUNTER — Ambulatory Visit: Payer: Medicaid Other | Attending: Obstetrics and Gynecology

## 2020-09-24 ENCOUNTER — Encounter: Payer: Self-pay | Admitting: *Deleted

## 2020-09-24 VITALS — BP 118/77 | HR 115

## 2020-09-24 DIAGNOSIS — E559 Vitamin D deficiency, unspecified: Secondary | ICD-10-CM | POA: Diagnosis not present

## 2020-09-24 DIAGNOSIS — O43192 Other malformation of placenta, second trimester: Secondary | ICD-10-CM

## 2020-09-24 DIAGNOSIS — Z362 Encounter for other antenatal screening follow-up: Secondary | ICD-10-CM | POA: Diagnosis not present

## 2020-09-24 DIAGNOSIS — Z3403 Encounter for supervision of normal first pregnancy, third trimester: Secondary | ICD-10-CM | POA: Diagnosis not present

## 2020-09-24 DIAGNOSIS — O403XX0 Polyhydramnios, third trimester, not applicable or unspecified: Secondary | ICD-10-CM | POA: Diagnosis not present

## 2020-09-24 DIAGNOSIS — Q270 Congenital absence and hypoplasia of umbilical artery: Secondary | ICD-10-CM | POA: Insufficient documentation

## 2020-09-24 DIAGNOSIS — Z331 Pregnant state, incidental: Secondary | ICD-10-CM | POA: Diagnosis not present

## 2020-09-24 DIAGNOSIS — Z3A36 36 weeks gestation of pregnancy: Secondary | ICD-10-CM | POA: Diagnosis not present

## 2020-09-24 DIAGNOSIS — Z3686 Encounter for antenatal screening for cervical length: Secondary | ICD-10-CM | POA: Diagnosis not present

## 2020-09-30 LAB — OB RESULTS CONSOLE GBS: GBS: NEGATIVE

## 2020-10-01 ENCOUNTER — Other Ambulatory Visit: Payer: Self-pay

## 2020-10-01 ENCOUNTER — Ambulatory Visit: Payer: Medicaid Other | Attending: Obstetrics and Gynecology

## 2020-10-01 ENCOUNTER — Ambulatory Visit: Payer: Medicaid Other | Admitting: *Deleted

## 2020-10-01 ENCOUNTER — Encounter: Payer: Self-pay | Admitting: *Deleted

## 2020-10-01 VITALS — BP 136/74 | HR 105

## 2020-10-01 DIAGNOSIS — Z362 Encounter for other antenatal screening follow-up: Secondary | ICD-10-CM | POA: Diagnosis not present

## 2020-10-01 DIAGNOSIS — Q270 Congenital absence and hypoplasia of umbilical artery: Secondary | ICD-10-CM

## 2020-10-01 DIAGNOSIS — O368930 Maternal care for other specified fetal problems, third trimester, not applicable or unspecified: Secondary | ICD-10-CM | POA: Diagnosis not present

## 2020-10-01 DIAGNOSIS — O403XX0 Polyhydramnios, third trimester, not applicable or unspecified: Secondary | ICD-10-CM | POA: Diagnosis not present

## 2020-10-01 DIAGNOSIS — O09893 Supervision of other high risk pregnancies, third trimester: Secondary | ICD-10-CM | POA: Diagnosis not present

## 2020-10-01 DIAGNOSIS — Z3A37 37 weeks gestation of pregnancy: Secondary | ICD-10-CM | POA: Diagnosis not present

## 2020-10-09 ENCOUNTER — Other Ambulatory Visit: Payer: Self-pay | Admitting: Obstetrics and Gynecology

## 2020-10-10 ENCOUNTER — Telehealth (HOSPITAL_COMMUNITY): Payer: Self-pay | Admitting: *Deleted

## 2020-10-10 ENCOUNTER — Encounter (HOSPITAL_COMMUNITY): Payer: Self-pay | Admitting: *Deleted

## 2020-10-10 NOTE — Telephone Encounter (Signed)
Preadmission screen  

## 2020-10-12 ENCOUNTER — Other Ambulatory Visit (HOSPITAL_COMMUNITY)
Admission: RE | Admit: 2020-10-12 | Discharge: 2020-10-12 | Disposition: A | Discharge status: Home / Self Care | Payer: Medicaid Other | Source: Ambulatory Visit | Attending: Obstetrics and Gynecology | Admitting: Obstetrics and Gynecology

## 2020-10-12 DIAGNOSIS — Z20822 Contact with and (suspected) exposure to covid-19: Secondary | ICD-10-CM | POA: Diagnosis not present

## 2020-10-12 DIAGNOSIS — Z01812 Encounter for preprocedural laboratory examination: Secondary | ICD-10-CM | POA: Diagnosis present

## 2020-10-12 LAB — SARS CORONAVIRUS 2 (TAT 6-24 HRS): SARS Coronavirus 2: NEGATIVE

## 2020-10-15 ENCOUNTER — Inpatient Hospital Stay (HOSPITAL_COMMUNITY): Payer: Medicaid Other

## 2020-10-15 ENCOUNTER — Inpatient Hospital Stay (HOSPITAL_COMMUNITY)
Admission: AD | Admit: 2020-10-15 | Discharge: 2020-10-16 | DRG: 807 | Disposition: A | Discharge status: Home / Self Care | Payer: Medicaid Other | Attending: Obstetrics and Gynecology | Admitting: Obstetrics and Gynecology

## 2020-10-15 ENCOUNTER — Other Ambulatory Visit: Payer: Self-pay

## 2020-10-15 ENCOUNTER — Encounter (HOSPITAL_COMMUNITY): Payer: Self-pay | Admitting: Obstetrics and Gynecology

## 2020-10-15 ENCOUNTER — Inpatient Hospital Stay (HOSPITAL_COMMUNITY): Payer: Medicaid Other | Admitting: Anesthesiology

## 2020-10-15 DIAGNOSIS — D649 Anemia, unspecified: Secondary | ICD-10-CM | POA: Diagnosis not present

## 2020-10-15 DIAGNOSIS — Z3A39 39 weeks gestation of pregnancy: Secondary | ICD-10-CM | POA: Diagnosis not present

## 2020-10-15 DIAGNOSIS — O26893 Other specified pregnancy related conditions, third trimester: Secondary | ICD-10-CM | POA: Diagnosis not present

## 2020-10-15 DIAGNOSIS — Z3A00 Weeks of gestation of pregnancy not specified: Secondary | ICD-10-CM | POA: Diagnosis not present

## 2020-10-15 DIAGNOSIS — O43893 Other placental disorders, third trimester: Secondary | ICD-10-CM | POA: Diagnosis not present

## 2020-10-15 DIAGNOSIS — Z3490 Encounter for supervision of normal pregnancy, unspecified, unspecified trimester: Secondary | ICD-10-CM | POA: Diagnosis present

## 2020-10-15 DIAGNOSIS — E559 Vitamin D deficiency, unspecified: Secondary | ICD-10-CM | POA: Diagnosis not present

## 2020-10-15 DIAGNOSIS — O9902 Anemia complicating childbirth: Secondary | ICD-10-CM | POA: Diagnosis not present

## 2020-10-15 LAB — CBC
HCT: 28.2 % — ABNORMAL LOW (ref 36.0–46.0)
Hemoglobin: 8.7 g/dL — ABNORMAL LOW (ref 12.0–15.0)
MCH: 25.4 pg — ABNORMAL LOW (ref 26.0–34.0)
MCHC: 30.9 g/dL (ref 30.0–36.0)
MCV: 82.2 fL (ref 80.0–100.0)
Platelets: 298 10*3/uL (ref 150–400)
RBC: 3.43 MIL/uL — ABNORMAL LOW (ref 3.87–5.11)
RDW: 14.7 % (ref 11.5–15.5)
WBC: 9.1 10*3/uL (ref 4.0–10.5)
nRBC: 0 % (ref 0.0–0.2)

## 2020-10-15 LAB — TYPE AND SCREEN
ABO/RH(D): A POS
Antibody Screen: NEGATIVE

## 2020-10-15 LAB — RPR: RPR Ser Ql: NONREACTIVE

## 2020-10-15 MED ORDER — ONDANSETRON HCL 4 MG/2ML IJ SOLN: 4.0000 mg | Freq: Four times a day (QID) | INTRAMUSCULAR | Status: DC | PRN

## 2020-10-15 MED ORDER — ONDANSETRON HCL 4 MG PO TABS: 4.0000 mg | ORAL_TABLET | ORAL | Status: DC | PRN

## 2020-10-15 MED ORDER — EPHEDRINE 5 MG/ML INJ: 10.0000 mg | INTRAVENOUS | Status: DC | PRN

## 2020-10-15 MED ORDER — BENZOCAINE-MENTHOL 20-0.5 % EX AERO: 1.0000 "application " | INHALATION_SPRAY | CUTANEOUS | Status: DC | PRN

## 2020-10-15 MED ORDER — LACTATED RINGERS IV SOLN: 500.0000 mL | Freq: Once | INTRAVENOUS | Status: DC

## 2020-10-15 MED ORDER — SIMETHICONE 80 MG PO CHEW: 80.0000 mg | CHEWABLE_TABLET | ORAL | Status: DC | PRN

## 2020-10-15 MED ORDER — LACTATED RINGERS IV SOLN: INTRAVENOUS | Status: DC

## 2020-10-15 MED ORDER — ZOLPIDEM TARTRATE 5 MG PO TABS: 5.0000 mg | ORAL_TABLET | Freq: Every evening | ORAL | Status: DC | PRN

## 2020-10-15 MED ORDER — MISOPROSTOL 25 MCG QUARTER TABLET
25.0000 ug | ORAL_TABLET | ORAL | Status: DC | PRN
  Filled 2020-10-15: qty 1

## 2020-10-15 MED ORDER — DIBUCAINE (PERIANAL) 1 % EX OINT: 1.0000 "application " | TOPICAL_OINTMENT | CUTANEOUS | Status: DC | PRN

## 2020-10-15 MED ORDER — ACETAMINOPHEN 325 MG PO TABS: 650.0000 mg | ORAL_TABLET | ORAL | Status: DC | PRN

## 2020-10-15 MED ORDER — TERBUTALINE SULFATE 1 MG/ML IJ SOLN: 0.2500 mg | Freq: Once | INTRAMUSCULAR | Status: DC | PRN

## 2020-10-15 MED ORDER — OXYTOCIN-SODIUM CHLORIDE 30-0.9 UT/500ML-% IV SOLN
1.0000 m[IU]/min | INTRAVENOUS | Status: DC
  Administered 2020-10-15: 2 m[IU]/min via INTRAVENOUS

## 2020-10-15 MED ORDER — OXYTOCIN-SODIUM CHLORIDE 30-0.9 UT/500ML-% IV SOLN
2.5000 [IU]/h | INTRAVENOUS | Status: DC
  Administered 2020-10-15: 2.5 [IU]/h via INTRAVENOUS
  Filled 2020-10-15: qty 500

## 2020-10-15 MED ORDER — DIPHENHYDRAMINE HCL 50 MG/ML IJ SOLN: 12.5000 mg | INTRAMUSCULAR | Status: DC | PRN

## 2020-10-15 MED ORDER — FENTANYL-BUPIVACAINE-NACL 0.5-0.125-0.9 MG/250ML-% EP SOLN
12.0000 mL/h | EPIDURAL | Status: DC | PRN
  Filled 2020-10-15: qty 250

## 2020-10-15 MED ORDER — FENTANYL CITRATE (PF) 100 MCG/2ML IJ SOLN: 50.0000 ug | INTRAMUSCULAR | Status: DC | PRN

## 2020-10-15 MED ORDER — LIDOCAINE HCL (PF) 1 % IJ SOLN
INTRAMUSCULAR | Status: DC | PRN
  Administered 2020-10-15: 5 mL via EPIDURAL

## 2020-10-15 MED ORDER — PHENYLEPHRINE 40 MCG/ML (10ML) SYRINGE FOR IV PUSH (FOR BLOOD PRESSURE SUPPORT): 80.0000 ug | PREFILLED_SYRINGE | INTRAVENOUS | Status: DC | PRN

## 2020-10-15 MED ORDER — WITCH HAZEL-GLYCERIN EX PADS: 1.0000 "application " | MEDICATED_PAD | CUTANEOUS | Status: DC | PRN

## 2020-10-15 MED ORDER — SOD CITRATE-CITRIC ACID 500-334 MG/5ML PO SOLN: 30.0000 mL | ORAL | Status: DC | PRN

## 2020-10-15 MED ORDER — TETANUS-DIPHTH-ACELL PERTUSSIS 5-2.5-18.5 LF-MCG/0.5 IM SUSY: 0.5000 mL | PREFILLED_SYRINGE | Freq: Once | INTRAMUSCULAR | Status: DC

## 2020-10-15 MED ORDER — OXYCODONE HCL 5 MG PO TABS: 5.0000 mg | ORAL_TABLET | ORAL | Status: DC | PRN

## 2020-10-15 MED ORDER — FENTANYL-BUPIVACAINE-NACL 0.5-0.125-0.9 MG/250ML-% EP SOLN
EPIDURAL | Status: DC | PRN
  Administered 2020-10-15: 12 mL/h via EPIDURAL

## 2020-10-15 MED ORDER — LACTATED RINGERS IV SOLN: 500.0000 mL | INTRAVENOUS | Status: DC | PRN

## 2020-10-15 MED ORDER — PRENATAL MULTIVITAMIN CH
1.0000 | ORAL_TABLET | Freq: Every day | ORAL | Status: DC
  Administered 2020-10-15 – 2020-10-16 (×2): 1 via ORAL
  Filled 2020-10-15 (×2): qty 1

## 2020-10-15 MED ORDER — COCONUT OIL OIL: 1.0000 "application " | TOPICAL_OIL | Status: DC | PRN

## 2020-10-15 MED ORDER — LIDOCAINE HCL (PF) 1 % IJ SOLN: 30.0000 mL | INTRAMUSCULAR | Status: DC | PRN

## 2020-10-15 MED ORDER — OXYCODONE HCL 5 MG PO TABS
10.0000 mg | ORAL_TABLET | ORAL | Status: DC | PRN
Start: 2020-10-15 — End: 2020-10-16

## 2020-10-15 MED ORDER — OXYTOCIN BOLUS FROM INFUSION
333.0000 mL | Freq: Once | INTRAVENOUS | Status: AC
  Administered 2020-10-15: 333 mL via INTRAVENOUS

## 2020-10-15 MED ORDER — IBUPROFEN 600 MG PO TABS
600.0000 mg | ORAL_TABLET | Freq: Four times a day (QID) | ORAL | Status: DC
  Administered 2020-10-15 – 2020-10-16 (×4): 600 mg via ORAL
  Filled 2020-10-15 (×5): qty 1

## 2020-10-15 MED ORDER — SENNOSIDES-DOCUSATE SODIUM 8.6-50 MG PO TABS: 2.0000 | ORAL_TABLET | Freq: Every day | ORAL | Status: DC

## 2020-10-15 MED ORDER — ONDANSETRON HCL 4 MG/2ML IJ SOLN: 4.0000 mg | INTRAMUSCULAR | Status: DC | PRN

## 2020-10-15 MED ORDER — DIPHENHYDRAMINE HCL 25 MG PO CAPS: 25.0000 mg | ORAL_CAPSULE | Freq: Four times a day (QID) | ORAL | Status: DC | PRN

## 2020-10-15 NOTE — Anesthesia Preprocedure Evaluation (Addendum)
Anesthesia Evaluation  Patient identified by MRN, date of birth, ID band Patient awake    Reviewed: Allergy & Precautions, NPO status , Patient's Chart, lab work & pertinent test results  Airway Mallampati: II  TM Distance: >3 FB Neck ROM: Full    Dental no notable dental hx. (+) Teeth Intact, Dental Advisory Given   Pulmonary neg pulmonary ROS,    Pulmonary exam normal breath sounds clear to auscultation       Cardiovascular Exercise Tolerance: Good Normal cardiovascular exam Rhythm:Regular Rate:Normal     Neuro/Psych negative neurological ROS  negative psych ROS   GI/Hepatic Neg liver ROS,   Endo/Other  negative endocrine ROS  Renal/GU negative Renal ROS     Musculoskeletal   Abdominal   Peds  Hematology  (+) anemia , Lab Results      Component                Value               Date                      WBC                      9.1                 10/15/2020                HGB                      8.7 (L)             10/15/2020                HCT                      28.2 (L)            10/15/2020                MCV                      82.2                10/15/2020                PLT                      298                 10/15/2020              Anesthesia Other Findings   Reproductive/Obstetrics (+) Pregnancy                             Anesthesia Physical Anesthesia Plan  ASA: III  Anesthesia Plan:    Post-op Pain Management:    Induction:   PONV Risk Score and Plan:   Airway Management Planned:   Additional Equipment:   Intra-op Plan:   Post-operative Plan:   Informed Consent: I have reviewed the patients History and Physical, chart, labs and discussed the procedure including the risks, benefits and alternatives for the proposed anesthesia with the patient or authorized representative who has indicated his/her understanding and acceptance.       Plan  Discussed with:   Anesthesia Plan Comments: (39.1 wk  G2P1 w anemia for LEA)        Anesthesia Quick Evaluation

## 2020-10-15 NOTE — Anesthesia Postprocedure Evaluation (Signed)
Anesthesia Post Note  Patient: Ramond Craver  Procedure(s) Performed: AN AD HOC LABOR EPIDURAL     Patient location during evaluation: Mother Baby Anesthesia Type: Epidural Level of consciousness: awake and alert Pain management: pain level controlled Vital Signs Assessment: post-procedure vital signs reviewed and stable Respiratory status: spontaneous breathing, nonlabored ventilation and respiratory function stable Cardiovascular status: stable Postop Assessment: no headache, no backache, patient able to bend at knees, no apparent nausea or vomiting, adequate PO intake and able to ambulate Anesthetic complications: no   No complications documented.  Last Vitals:  Vitals:   10/15/20 1146 10/15/20 1528  BP: 114/83 111/71  Pulse: 84 87  Resp: 17 17  Temp: 36.6 C 36.7 C  SpO2: 100% 98%    Last Pain:  Vitals:   10/15/20 1528  TempSrc: Oral  PainSc: 0-No pain   Pain Goal:                   Blythe Stanford

## 2020-10-15 NOTE — H&P (Signed)
OB ADMISSION/ HISTORY & PHYSICAL:  Admission Date: 10/15/2020 12:01 AM  Admit Diagnosis: IOL for 2 vessel cord  Carrie Cooper is a 30 y.o. female G2P1001 [redacted]w[redacted]d presenting for IOL. Endorses active FM, denies LOF and vaginal bleeding. 2 vessel cord noted on anatomy Carrie Cooper, follow-up Echo was normal. Polyhydramnios dx in 3rd trimster but resolved by 36 wks. Discussed IOL options. Will defer cervical ripening, SVE 3/70/-2, will start Pitocin. Plans epidural, expecting a boy, desires in-pt circ.   History of current pregnancy: G2P1001   Patient entered care with [facility] at 10 wks.   EDC 10/21/20 by Carrie Cooper @ 10 wks due to uncertain LMP. Pt was breastfeeding during conception   Anatomy scan:  complete w/ posterior placenta and 2VC, follow-up ECHO normal   Antenatal testing: for 2VC, polyhydramnios resolved @ 36 wks Last evaluation: 10/01/20  wks vertex/ posterior placenta/ AFI 23.1/ EFW 8#  Significant prenatal events:  Patient Active Problem List   Diagnosis Date Noted  . Thrombosis of vessels of cord, fetus 2 10/15/2020  . Normal labor 10/15/2020  . Vitamin D deficiency 10/15/2020    Prenatal Labs: ABO, Rh:  A positive Antibody:  negative Rubella:  immune RPR:   NR HBsAg:   NR HIV:   NR GTT: passed 1 hr GBS:   negative GC/CHL: neg/neg Genetics: low-risk female, Horizon neg Tdap/influenza vaccines: declined both   OB History  Gravida Para Term Preterm AB Living  2 1 1     1   SAB IAB Ectopic Multiple Live Births        0 1    # Outcome Date GA Lbr Len/2nd Weight Sex Delivery Anes PTL Lv  2 Current           1 Term 07/03/19 [redacted]w[redacted]d 10:13 / 00:19 3605 g M Vag-Spont EPI  LIV    Medical / Surgical History: Past medical history:  Past Medical History:  Diagnosis Date  . Anemia     Past surgical history:  Past Surgical History:  Procedure Laterality Date  . WISDOM TOOTH EXTRACTION     no sedation due to being done with preg   Family History:  Family History  Problem Relation Age of  Onset  . Cancer Mother     Social History:  reports that she has never smoked. She has never used smokeless tobacco. She reports that she does not drink alcohol and does not use drugs.  Allergies: Patient has no known allergies.   Current Medications at time of admission:  Prior to Admission medications   Medication Sig Start Date End Date Taking? Authorizing Provider  acetaminophen (TYLENOL) 500 MG tablet Take 500 mg by mouth every 6 (six) hours as needed for mild pain or headache.    [provider]  Cholecalciferol (VITAMIN D3) 50 MCG (2000 UT) TABS Take 50 mcg by mouth.    [provider]  ibuprofen (ADVIL) 600 MG tablet Take 1 tablet (600 mg total) by mouth every 6 (six) hours. Patient not taking: Reported on 07/11/2020 07/04/19   Prothero, 07/06/19, CNM  ondansetron (ZOFRAN-ODT) 4 MG disintegrating tablet Take 4 mg by mouth every 8 (eight) hours as needed for nausea or vomiting. Patient not taking: Reported on 08/15/2020    [provider]  pantoprazole (PROTONIX) 40 MG tablet Take 40 mg by mouth daily as needed. 06/21/19   [provider]    Review of Systems: Constitutional: Negative   HENT: Negative   Eyes: Negative   Respiratory: Negative  Cardiovascular: Negative   Gastrointestinal: Negative  Genitourinary: neg for bloody show, neg for LOF   Musculoskeletal: Negative   Skin: Negative   Neurological: Negative   Endo/Heme/Allergies: Negative   Psychiatric/Behavioral: Negative    Physical Exam: VS: Last menstrual period 01/04/2020, unknown if currently breastfeeding. AAO x3, no signs of distress Cardiovascular: RRR Respiratory: Lung fields clear to ausculation GU/GI: Abdomen gravid, non-tender, non-distended, active FM, vertex, EFW 8# per Leopold's Extremities: no edema, negative for pain, tenderness, and cords  Cervical exam:Dilation: 3 Effacement (%): 70 Station: -2 Exam by:: Lizbeth Bark, CNM FHR: baseline rate 135 /  variability moderate / accelerations present / absent decelerations TOCO: 1.5-2 min   Prenatal Transfer Tool  Maternal Diabetes: No Genetic Screening: Normal Maternal Ultrasounds/Referrals: Other:2 vessel cord Fetal Ultrasounds or other Referrals:  Fetal echo Maternal Substance Abuse:  No Significant Maternal Medications:  None Significant Maternal Lab Results: Group B Strep negative    Assessment: 30 y.o. G2P1001 [redacted]w[redacted]d  IOL for 2 vessel cord FHR category 1 GBS negative Pain management plan: epidural   Plan:  Admit to L&D Routine admission orders Epidural PRN Pitocin 2x2  Dr Richardson Dopp notified of admission and plan of care  Roma Schanz MSN, CNM 10/15/2020 12:14 AM

## 2020-10-15 NOTE — Anesthesia Procedure Notes (Signed)
Epidural Patient location during procedure: OB Start time: 10/15/2020 3:40 AM End time: 10/15/2020 3:57 AM  Staffing Anesthesiologist: Trevor Iha, MD Performed: anesthesiologist   Preanesthetic Checklist Completed: patient identified, IV checked, site marked, risks and benefits discussed, surgical consent, monitors and equipment checked, pre-op evaluation and timeout performed  Epidural Patient position: sitting Prep: DuraPrep and site prepped and draped Patient monitoring: continuous pulse ox and blood pressure Approach: midline Location: L3-L4 Injection technique: LOR air  Needle:  Needle type: Tuohy  Needle gauge: 17 G Needle length: 9 cm and 9 Needle insertion depth: 7 cm Catheter type: closed end flexible Catheter size: 19 Gauge Catheter at skin depth: 12 cm Test dose: negative  Assessment Events: blood not aspirated, injection not painful, no injection resistance, no paresthesia and negative IV test  Additional Notes Patient identified. Risks/Benefits/Options discussed with patient including but not limited to bleeding, infection, nerve damage, paralysis, failed block, incomplete pain control, headache, blood pressure changes, nausea, vomiting, reactions to medication both or allergic, itching and postpartum back pain. Confirmed with bedside nurse the patient's most recent platelet count. Confirmed with patient that they are not currently taking any anticoagulation, have any bleeding history or any family history of bleeding disorders. Patient expressed understanding and wished to proceed. All questions were answered. Sterile technique was used throughout the entire procedure. Please see nursing notes for vital signs. Test dose was given through epidural needle and negative prior to continuing to dose epidural or start infusion. Warning signs of high block given to the patient including shortness of breath, tingling/numbness in hands, complete motor block, or any concerning  symptoms with instructions to call for help. Patient was given instructions on fall risk and not to get out of bed. All questions and concerns addressed with instructions to call with any issues. 2 Attempt (S) . Patient tolerated procedure well.

## 2020-10-16 LAB — CBC
HCT: 28.9 % — ABNORMAL LOW (ref 36.0–46.0)
Hemoglobin: 8.8 g/dL — ABNORMAL LOW (ref 12.0–15.0)
MCH: 25.4 pg — ABNORMAL LOW (ref 26.0–34.0)
MCHC: 30.4 g/dL (ref 30.0–36.0)
MCV: 83.3 fL (ref 80.0–100.0)
Platelets: 267 10*3/uL (ref 150–400)
RBC: 3.47 MIL/uL — ABNORMAL LOW (ref 3.87–5.11)
RDW: 14.8 % (ref 11.5–15.5)
WBC: 9.3 10*3/uL (ref 4.0–10.5)
nRBC: 0 % (ref 0.0–0.2)

## 2020-10-16 MED ORDER — IBUPROFEN 600 MG PO TABS: 600.0000 mg | ORAL_TABLET | Freq: Four times a day (QID) | ORAL | 0 refills | Status: AC

## 2020-10-16 MED ORDER — ETONOGESTREL 68 MG ~~LOC~~ IMPL
68.0000 mg | DRUG_IMPLANT | Freq: Once | SUBCUTANEOUS | Status: AC
  Administered 2020-10-16: 68 mg via SUBCUTANEOUS
  Filled 2020-10-16: qty 1

## 2020-10-16 MED ORDER — LIDOCAINE HCL 1 % IJ SOLN
0.0000 mL | Freq: Once | INTRAMUSCULAR | Status: AC | PRN
  Administered 2020-10-16: 20 mL via INTRADERMAL
  Filled 2020-10-16: qty 20

## 2020-10-16 NOTE — Discharge Summary (Signed)
Postpartum Discharge Summary  Date of Service updated 10/16/20    Patient Name: Carrie Cooper DOB: 06-25-1991 MRN: [id number]  Date of admission: 10/15/2020 Delivery date:10/15/2020  Delivering provider: Domingo Pulse  Date of discharge: 10/16/2020  Admitting diagnosis: Thrombosis of vessels of cord, fetus 2 [O69.5XX2] Intrauterine pregnancy: [redacted]w[redacted]d    Secondary diagnosis:  Active Problems:   Encounter for induction of labor   Normal labor   Vitamin D deficiency   SVD (2/8)  Additional problems: none    Discharge diagnosis: Term Pregnancy Delivered                                              Post partum procedures:none Augmentation: Pitocin Complications: None  Hospital course: Induction of Labor With Vaginal Delivery   30y.o. yo G2P2002 at 364w1das admitted to the hospital 10/15/2020 for induction of labor.  Indication for induction: Two vessel cord.  Patient had an uncomplicated labor course as follows: Membrane Rupture Time/Date: 8:15 AM ,10/15/2020   Delivery Method:Vaginal, Spontaneous  Episiotomy: None  Lacerations:  None  Details of delivery can be found in separate delivery note.  Patient had a routine postpartum course. Patient is discharged home 10/16/20.  Newborn Data: Birth date:10/15/2020  Birth time:8:47 AM  Gender:Female  Living status:Living  Apgars:9 ,10  [id number]   Magnesium Sulfate received: No BMZ received: No Rhophylac:N/A MMR:N/A T-DaP:declined Flu: declined Transfusion:No  Physical exam  Vitals:   10/15/20 1528 10/15/20 1955 10/15/20 2335 10/16/20 0543  BP: 111/71 125/89 101/63 (!) 120/58  Pulse: 87 65 80 85  Resp: 17 18 16 18   Temp: 98 F (36.7 C) 98.3 F (36.8 C) 98.5 F (36.9 C) 98 F (36.7 C)  TempSrc: Oral Oral Oral Oral  SpO2: 98%  99% 100%  Weight:      Height:       General: alert, cooperative and no distress Lochia: appropriate Uterine Fundus: firm Incision: N/A DVT Evaluation: No evidence of DVT seen on  physical exam. No cords or calf tenderness. No significant calf/ankle edema. Labs: Lab Results  Component Value Date   WBC 9.3 10/16/2020   HGB 8.8 (L) 10/16/2020   HCT 28.9 (L) 10/16/2020   MCV 83.3 10/16/2020   PLT 267 10/16/2020   CMP Latest Ref Rng & Units 01/11/2018  Glucose 65 - 99 mg/dL 93  BUN 6 - 20 mg/dL 11  Creatinine 0.44 - 1.00 mg/dL 0.83  Sodium 135 - 145 mmol/L 136  Potassium 3.5 - 5.1 mmol/L 3.9  Chloride 101 - 111 mmol/L 101  CO2 22 - 32 mmol/L 24  Calcium 8.9 - 10.3 mg/dL 9.2  Total Protein 6.5 - 8.1 g/dL 7.9  Total Bilirubin 0.3 - 1.2 mg/dL 0.7  Alkaline Phos 38 - 126 U/L 83  AST 15 - 41 U/L 28  ALT 14 - 54 U/L 27   Edinburgh Score: Edinburgh Postnatal Depression Scale Screening Tool 10/15/2020  I have been able to laugh and see the funny side of things. 0  I have looked forward with enjoyment to things. 0  I have blamed myself unnecessarily when things went wrong. 0  I have been anxious or worried for no good reason. 1  I have felt scared or panicky for no good reason. 0  Things have been getting on top of me. 0  I have been so unhappy that  I have had difficulty sleeping. 0  I have felt sad or miserable. 1  I have been so unhappy that I have been crying. 1  The thought of harming myself has occurred to me. 0  Edinburgh Postnatal Depression Scale Total 3      After visit meds:  Allergies as of 10/16/2020   No Known Allergies     Medication List    TAKE these medications   acetaminophen 500 MG tablet Commonly known as: TYLENOL Take 500 mg by mouth every 6 (six) hours as needed for mild pain or headache.   adapalene 0.1 % gel Commonly known as: DIFFERIN Apply 1 application topically at bedtime.   ibuprofen 600 MG tablet Commonly known as: ADVIL Take 1 tablet (600 mg total) by mouth every 6 (six) hours.   pantoprazole 40 MG tablet Commonly known as: PROTONIX Take 40 mg by mouth daily as needed.        Discharge home in stable  condition Infant Feeding: Breast and pumping Infant Disposition:home with mother Discharge instruction: per After Visit Summary and Postpartum booklet. Activity: Advance as tolerated. Pelvic rest for 6 weeks.  Diet: routine diet Anticipated Birth Control: Nexplanon and inserted prior to discharge Postpartum Appointment:6 weeks Additional Postpartum F/U: none Future Appointments:No future appointments. Follow up Visit:  [facility]. Go in 6 week(s).   Specialty: Obstetrics and Gynecology Contact information: [address] [place]  [id number] [phone]                  10/16/2020 Arrie Eastern, CNM

## 2020-10-17 LAB — SURGICAL PATHOLOGY

## 2020-10-18 ENCOUNTER — Encounter (HOSPITAL_COMMUNITY): Payer: Self-pay | Admitting: Obstetrics and Gynecology

## 2020-10-18 HISTORY — PX: NEXPLANON TRAY: NUR84248

## 2020-10-18 NOTE — Procedures (Signed)
Nexplanon insertion  Patient positioned supine w/ HOB elevated 30 degrees, left arm extended, site above tricep prepped with betadine, and 46mL of 1% buffered lidocaine used to anesthetize site.  Nexplanon inserted per protocol w/ caution to avoid sulcus. Pt tolerated the procedure well. Hemostasis noted at the end of procedure.  Nexplanon palpated by myself and the patient. Insertion card completed and copy provided to pt. Pt to have implant removed in 3 yrs.

## 2020-11-28 DIAGNOSIS — Z3046 Encounter for surveillance of implantable subdermal contraceptive: Secondary | ICD-10-CM | POA: Diagnosis not present

## 2020-11-28 DIAGNOSIS — N925 Other specified irregular menstruation: Secondary | ICD-10-CM | POA: Diagnosis not present

## 2021-03-24 DIAGNOSIS — F418 Other specified anxiety disorders: Secondary | ICD-10-CM | POA: Diagnosis not present

## 2021-03-24 DIAGNOSIS — Z6828 Body mass index (BMI) 28.0-28.9, adult: Secondary | ICD-10-CM | POA: Diagnosis not present

## 2021-03-24 DIAGNOSIS — K529 Noninfective gastroenteritis and colitis, unspecified: Secondary | ICD-10-CM | POA: Diagnosis not present

## 2021-04-24 DIAGNOSIS — Z6828 Body mass index (BMI) 28.0-28.9, adult: Secondary | ICD-10-CM | POA: Diagnosis not present

## 2021-04-24 DIAGNOSIS — F418 Other specified anxiety disorders: Secondary | ICD-10-CM | POA: Diagnosis not present

## 2022-06-30 IMAGING — US US MFM OB FOLLOW-UP
1 series · 13 of 28 positions shown · non-contrast
Comparison: none

[Series 1: us mfm ob follow-up · 45 acquisitions, 13 frames shown]
[im 2/45]
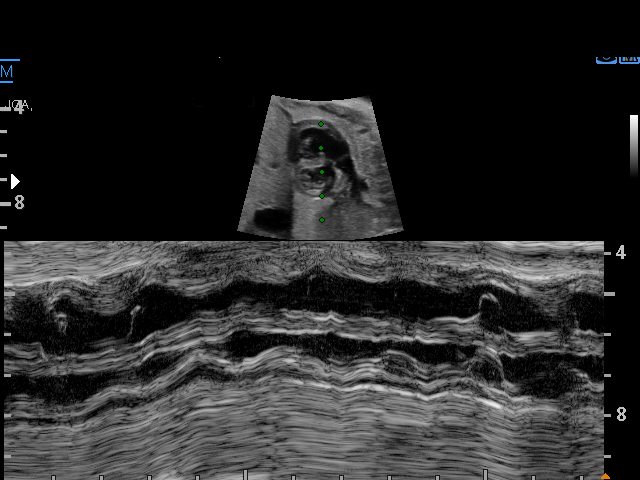
[im 5/45]
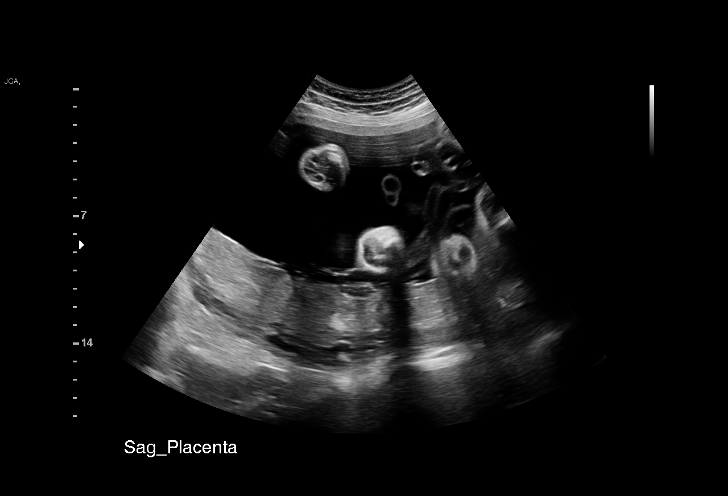
[im 9/45]
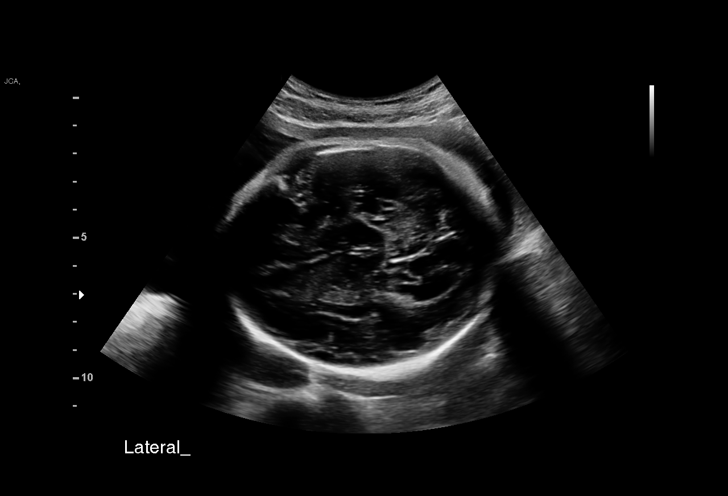
[im 12/45]
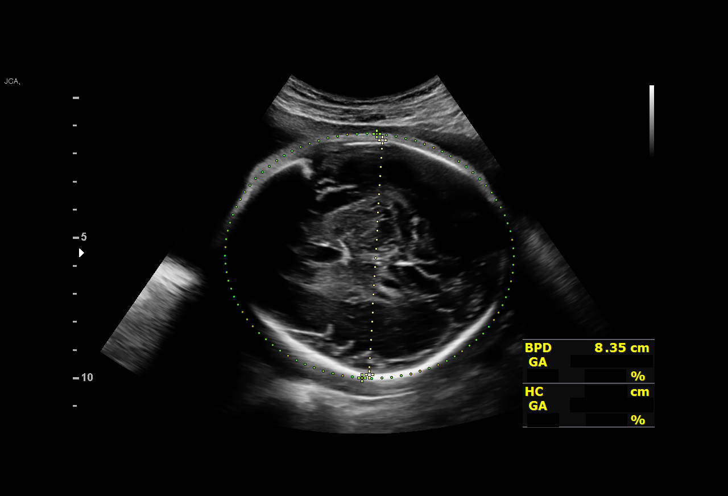
[im 15/45]
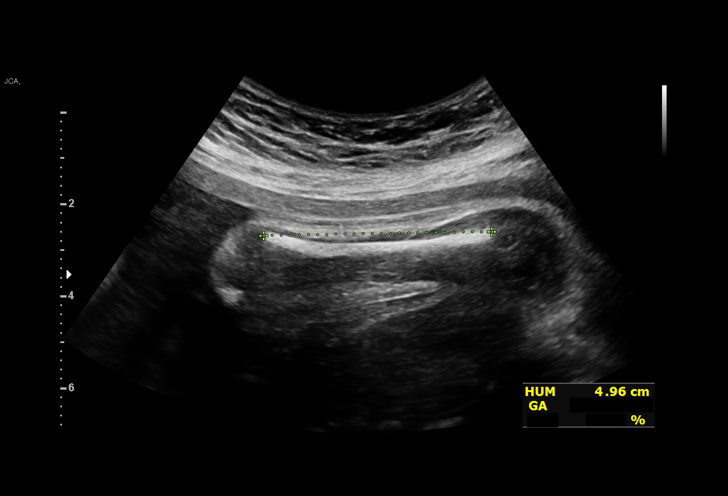
[im 18/45]
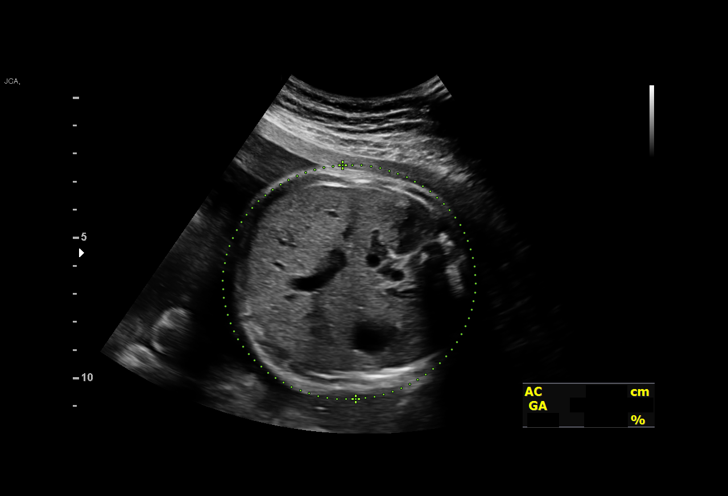
[im 23/45]
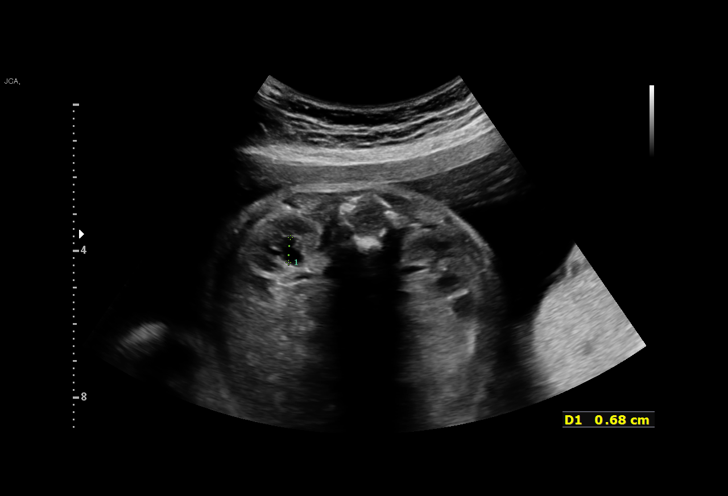
[im 27/45]
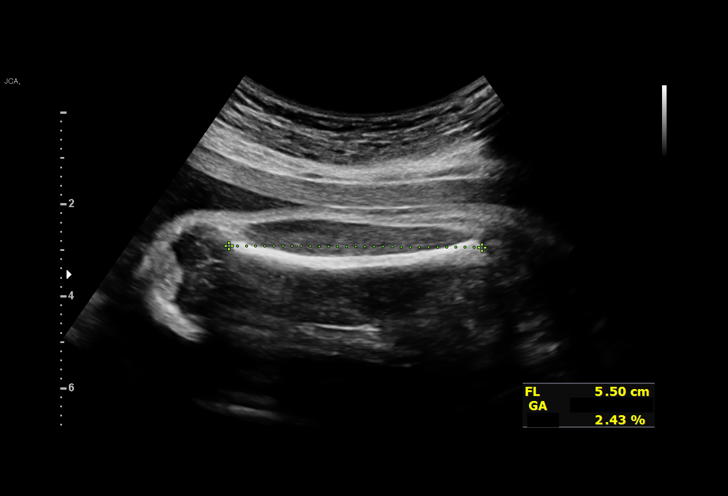
[im 30/45]
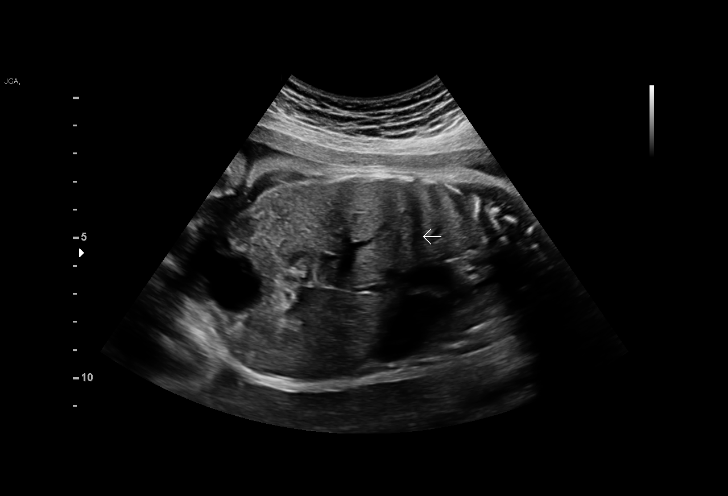
[im 33/45]
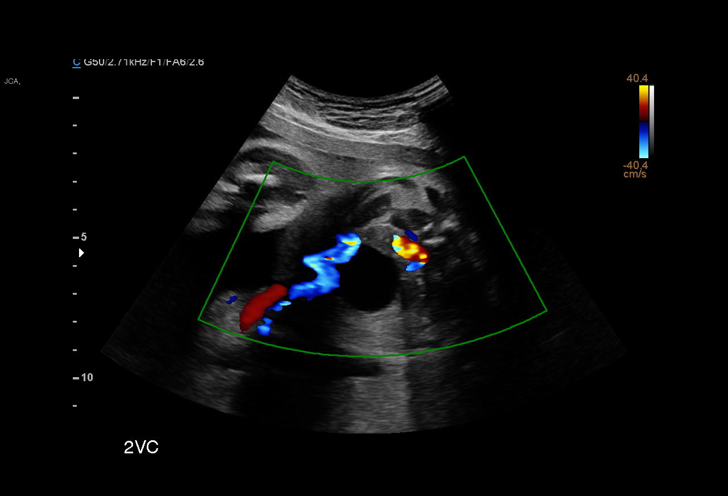
[im 36/45]
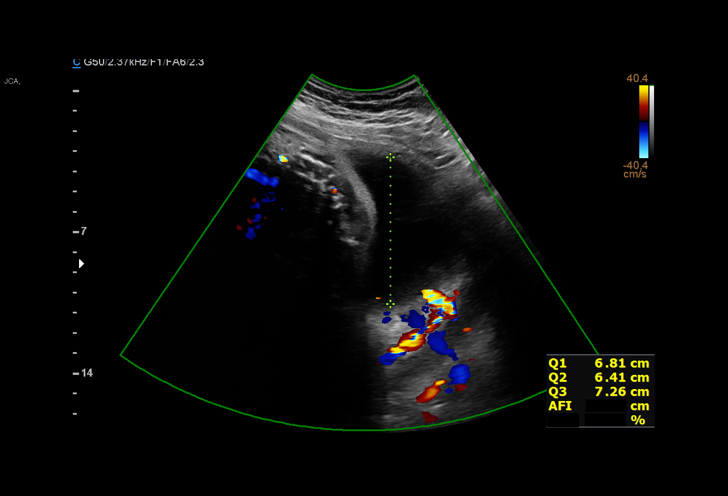
[im 40/45]
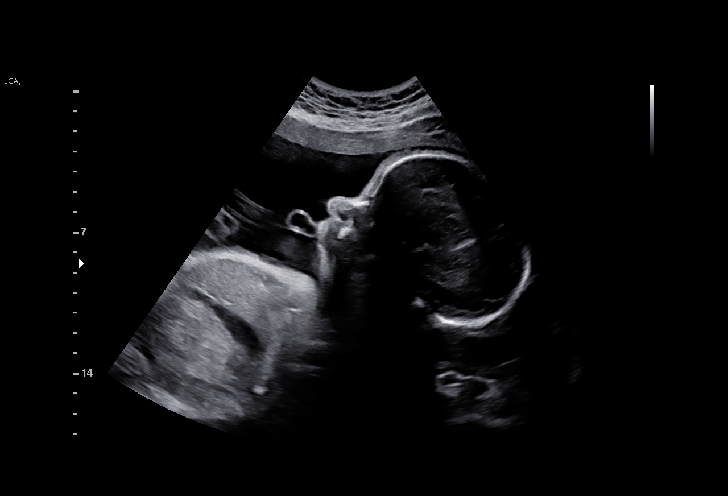
[im 43/45]
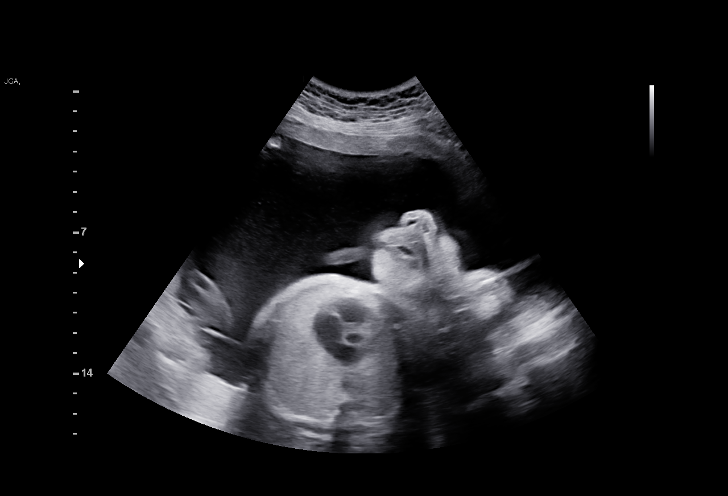

[13 of 28 positions shown; findings below may reference images not displayed]

Obstetrics &
                                                            Gynecology
                                                            [address]
                                                            [place].

Indications

 2 vessel umbilical cord
 Short interval between pregancies, 2nd
 trimester
 Encounter for other antenatal screening
 follow-up
 30 weeks gestation of pregnancy
Vital Signs

                                                Height:        5'4"
Fetal Evaluation

 Num Of Fetuses:         1
 Fetal Heart Rate(bpm):  173
 Cardiac Activity:       Observed
 Presentation:           Cephalic
 Placenta:               Posterior
 P. Cord Insertion:      Visualized

 Amniotic Fluid
 AFI FV:      Polyhydramnios

 AFI Sum(cm)     %Tile       Largest Pocket(cm)
 27.63           > 97
 RUQ(cm)       RLQ(cm)       LUQ(cm)        LLQ(cm)

Biometry

 BPD:      82.6  mm     G. Age:  33w 2d         97  %    CI:        76.86   %    70 - 86
                                                         FL/HC:      18.4   %    19.2 -
 HC:      298.4  mm     G. Age:  33w 0d         85  %    HC/AC:      1.08        0.99 -
 AC:      275.9  mm     G. Age:  31w 5d         80  %    FL/BPD:     66.3   %    71 - 87
 FL:       54.8  mm     G. Age:  29w 0d          6  %    FL/AC:      19.9   %    20 - 24
 HUM:      50.7  mm     G. Age:  29w 5d         37  %

 LV:        5.3  mm
 Est. FW:    9525  gm    3 lb 12 oz      61  %
OB History

 Gravidity:    2         Term:   1
 Living:       1
Gestational Age

 U/S Today:     31w 5d                                        EDD:   10/12/20
 Best:          30w 3d     Det. By:  Previous Ultrasound      EDD:   10/21/20
                                     (03/25/20)
Anatomy

 Cranium:               Appears normal         Aortic Arch:            Previously seen
 Cavum:                 Previously seen        Ductal Arch:            Previously seen
 Ventricles:            Previously seen        Diaphragm:              Appears normal
 Choroid Plexus:        Previously seen        Stomach:                Appears normal, left
                                                                       sided
 Cerebellum:            Previously seen        Abdomen:                Appears normal
 Posterior Fossa:       Previously seen        Abdominal Wall:         Appears nml (cord
                                                                       insert, abd wall)
 Nuchal Fold:           Previously seen        Cord Vessels:           2 vessel cord,
                                                                       absent Appolo Kifah
 Face:                  Orbits and profile     Kidneys:                Appear normal
                        previously seen
 Lips:                  Previously seen        Bladder:                Appears normal
 Thoracic:              Appears normal         Spine:                  Previously seen
 Heart:                 Previously seen        Upper Extremities:      Previously seen
 RVOT:                  Previously seen        Lower Extremities:      Previously seen
 LVOT:                  Previously seen

 Other:  Fetus appears to be a male. Heels and 5th digit previously visualized.
         Nasal bone prev visualized. Open hands previously visualized. VC,
         3VV and 3VTV prev visualized.
Comments

 This patient was seen for a follow up growth scan due to a
 fetus with a two-vessel umbilical cord.  She denies any
 problems since her last exam and reports that she has
 screened negative for gestational diabetes.
 She was informed that the fetal growth appears appropriate
 for her gestational age.  Mild polyhydramnios with a total AFI
 of 27.6 cm was noted today.
 The increased risk of preterm labor and PPROM associated
 with mild polyhydramnios was discussed.
 Due to polyhydramnios noted today, we will start weekly fetal
 testing at around 32 weeks.
 A biophysical profile was scheduled in 2 weeks.  A follow-up
 growth scan was scheduled in 4 weeks

## 2022-08-09 IMAGING — US US MFM FETAL BPP W/O NON-STRESS
1 series · 15 of 28 positions shown · non-contrast
Comparison: none

[Series 1: us mfm fetal bpp w/o non-stress · 35 acquisitions, 15 frames shown]
[im 1/35]
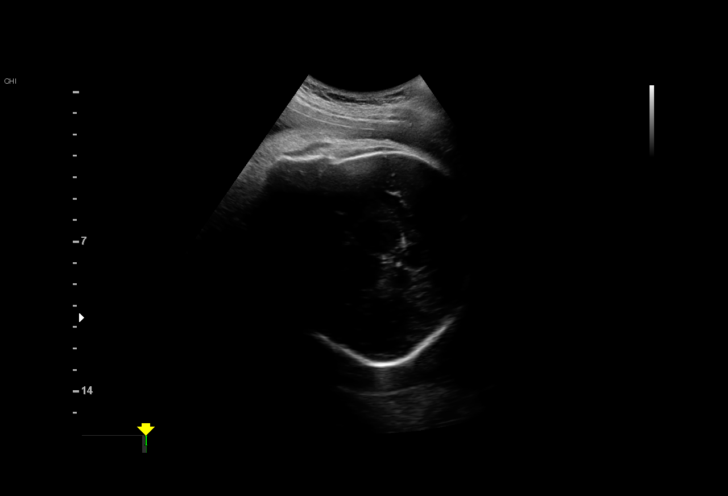
[im 3/35]
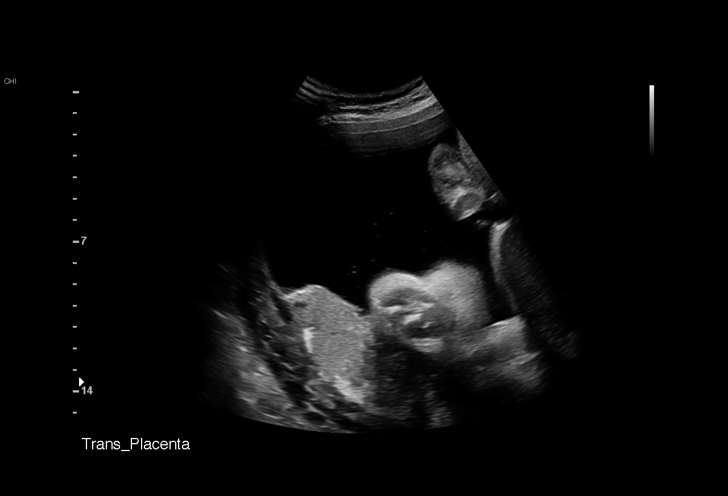
[im 6/35]
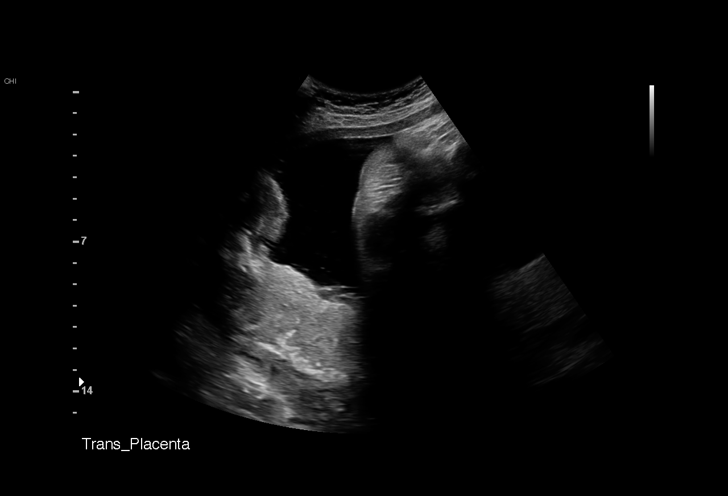
[im 8/35]
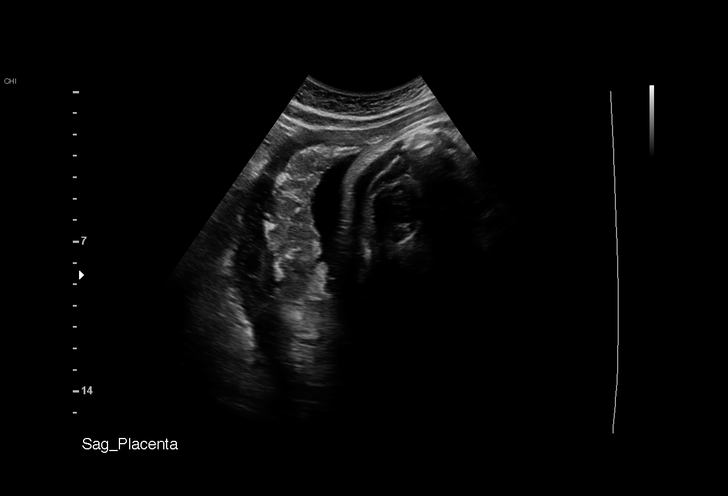
[im 11/35]
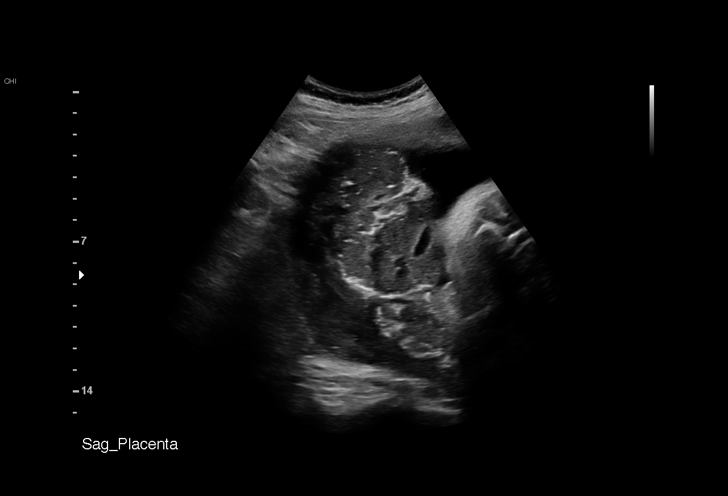
[im 13/35]
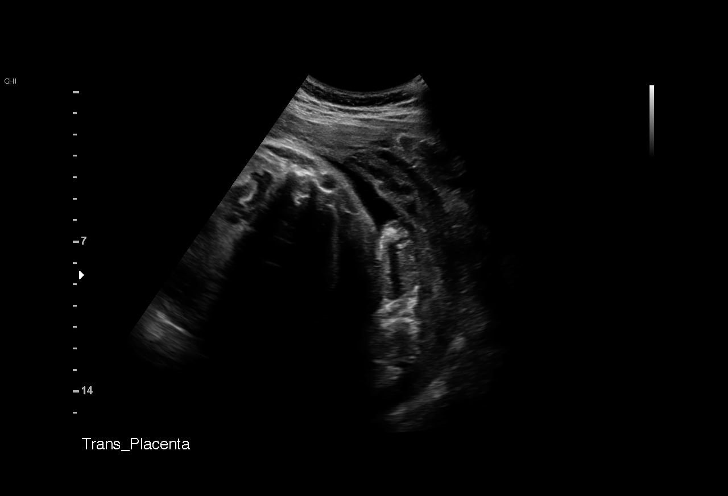
[im 16/35]
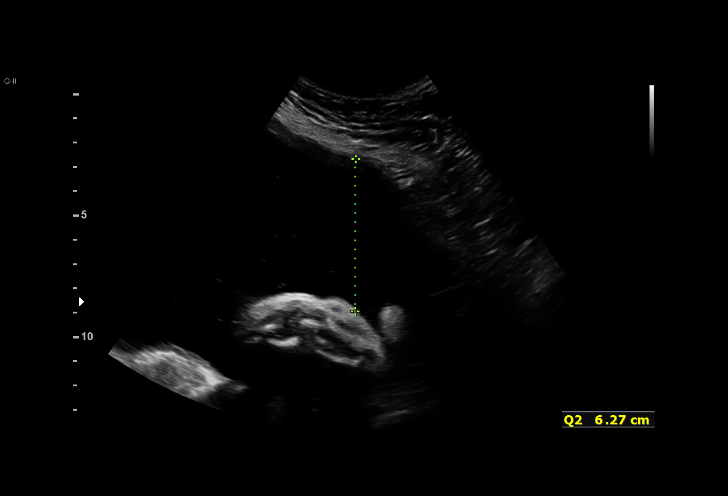
[im 18/35]
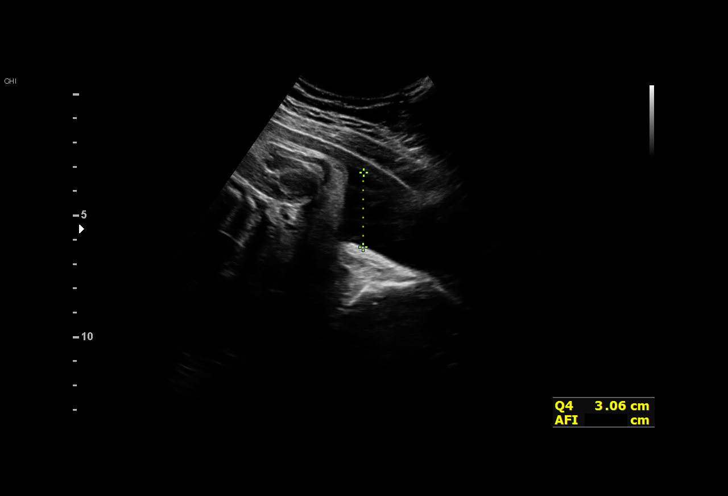
[im 19/35]
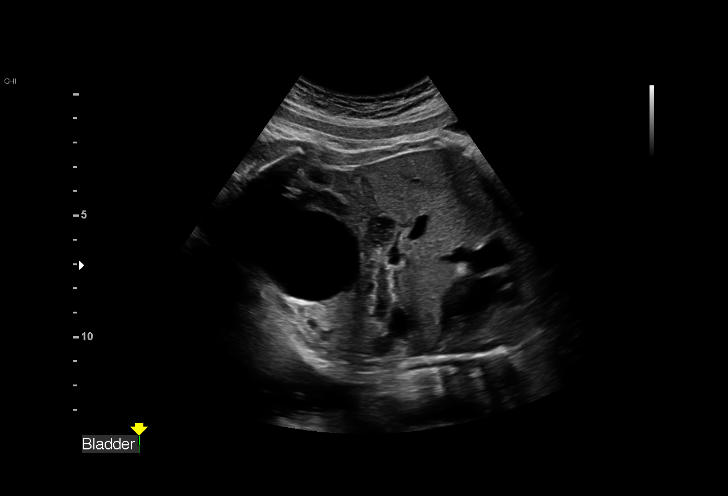
[im 22/35]
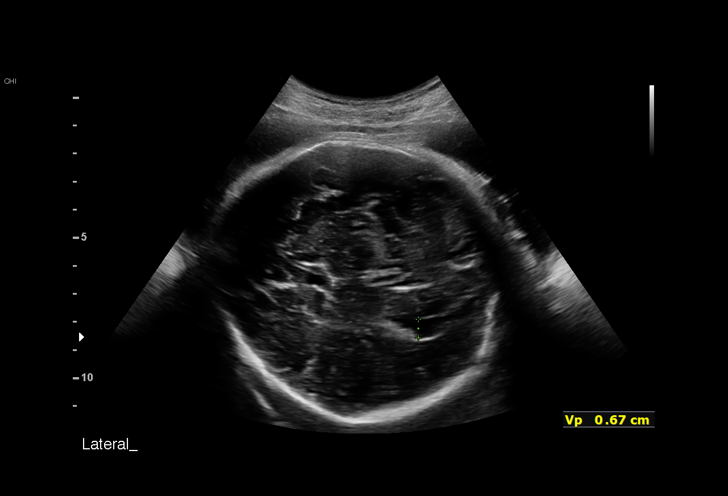
[im 24/35]
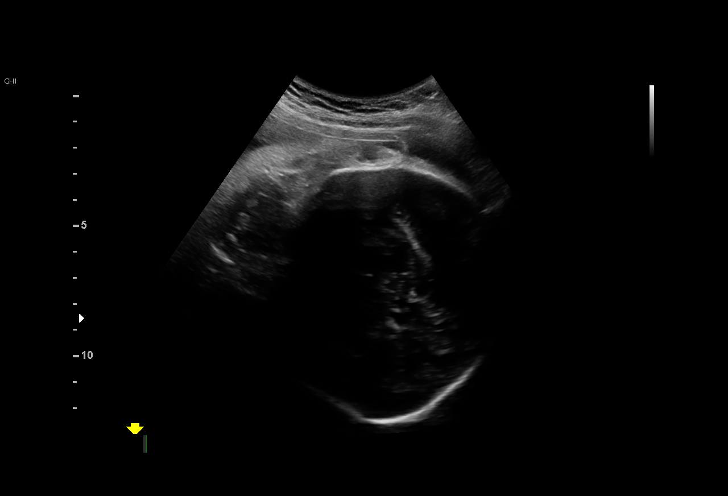
[im 27/35]
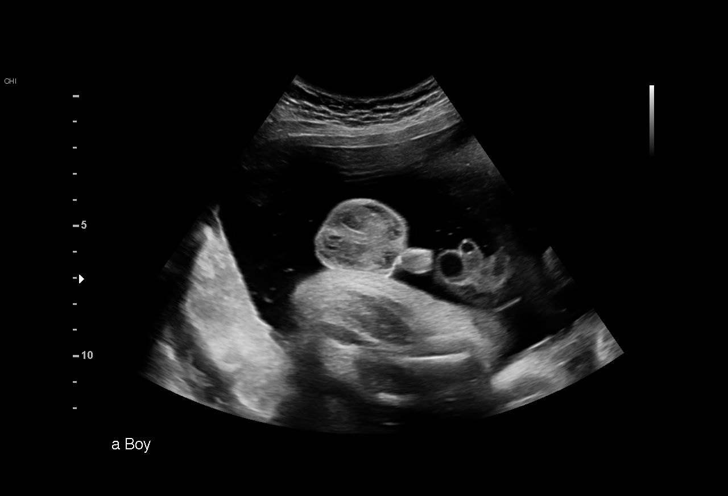
[im 29/35]
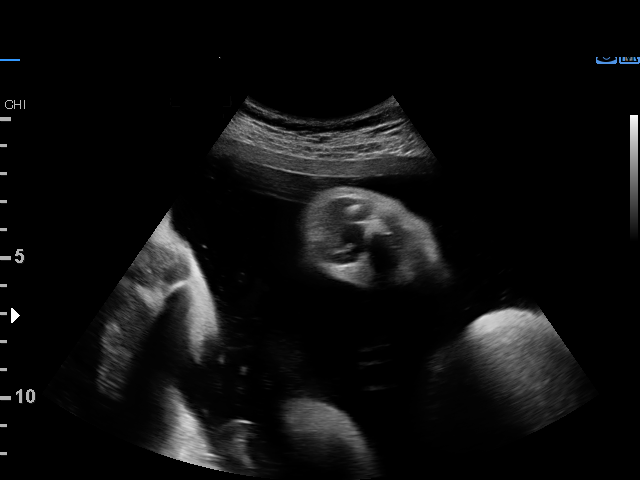
[im 32/35]
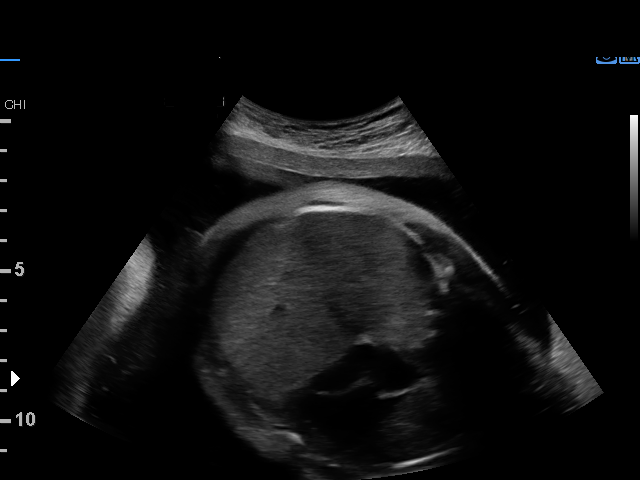
[im 35/35]
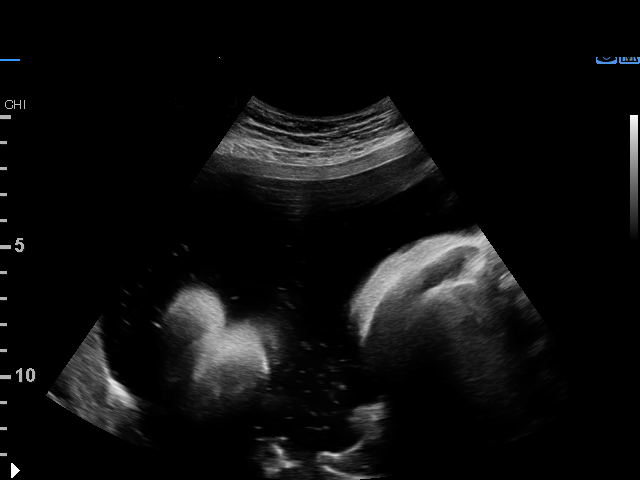

[15 of 28 positions shown; findings below may reference images not displayed]

Obstetrics &
                                                            Gynecology
                                                            [address]
                                                            [place].

Indications

 2 vessel umbilical cord
 Polyhydramnios, third trimester, antepartum
 condition or complication, unspecified fetus
 Short interval between pregancies, 2nd
 trimester
 Encounter for other antenatal screening
 follow-up
 36 weeks gestation of pregnancy
Vital Signs

                                                Height:        5'4"
Fetal Evaluation

 Num Of Fetuses:         1
 Fetal Heart Rate(bpm):  133
 Cardiac Activity:       Observed
 Presentation:           Cephalic
 Placenta:               Posterior
 P. Cord Insertion:      Previously Visualized
 Amniotic Fluid
 AFI FV:      Within normal limits

 AFI Sum(cm)     %Tile       Largest Pocket(cm)
 22              84

 RUQ(cm)       RLQ(cm)       LUQ(cm)        LLQ(cm)

Biophysical Evaluation

 Amniotic F.V:   Pocket => 2 cm             F. Tone:        Observed
 F. Movement:    Observed                   Score:          [DATE]
 F. Breathing:   Observed
Biometry

 LV:        6.7  mm
OB History

 Gravidity:    2         Term:   1
 Living:       1
Gestational Age

 Best:          36w 1d     Det. By:  Previous Ultrasound      EDD:   10/21/20
                                     (03/25/20)
Anatomy

 Ventricles:            Appears normal         Kidneys:                Appear normal
 Heart:                 Appears normal         Bladder:                Appears normal
                        (4CH, axis, and
                        situs)
 Stomach:               Appears normal, left
                        sided
Impression

 Single umbilical artery.  Polyhydramnios was seen on
 previous visit.

 Amniotic fluid is normal and good fetal activity is seen
 .Antenatal testing is reassuring. BPP [DATE].  Cephalic
 presentation.

 We reassured the patient of the findings.
Recommendations

 -Fetal growth assessment next week.
 -If BPP is reassuring next week and no polyhydramnios is
 seen, we will discontinue weekly antenatal testing.
                 Silv, Ailma

## 2022-08-16 IMAGING — US US MFM OB FOLLOW-UP
1 series · 13 of 20 positions shown · non-contrast
Comparison: none

[Series 1: us mfm ob follow-up · 13 of 20 slices shown]
[im 1/20]
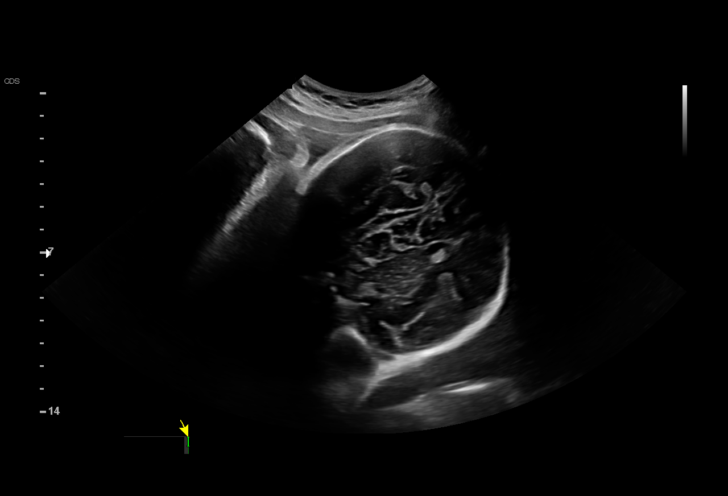
[im 3/20]
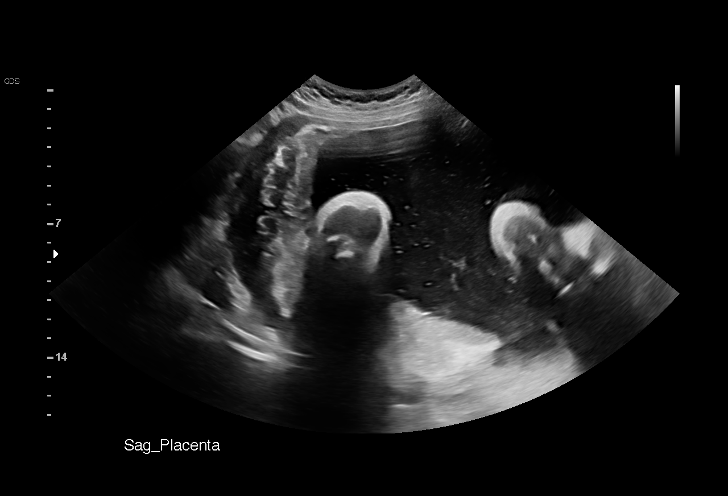
[im 4/20]
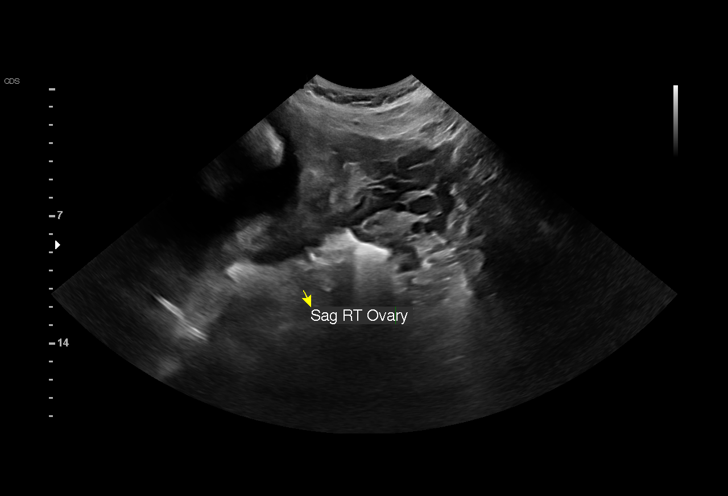
[im 6/20]
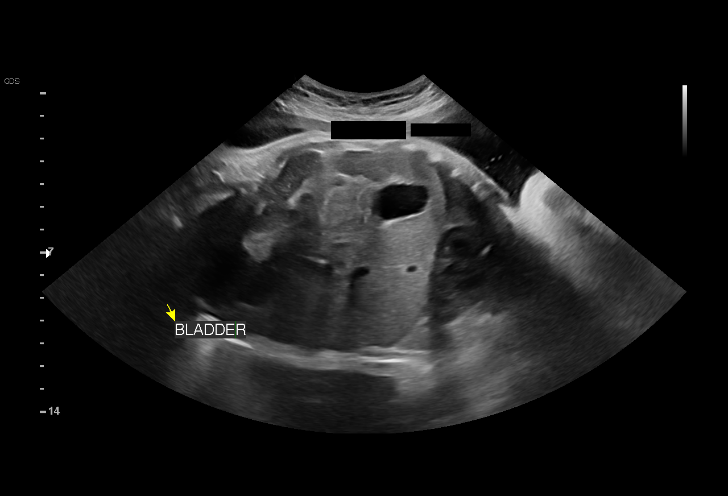
[im 7/20]
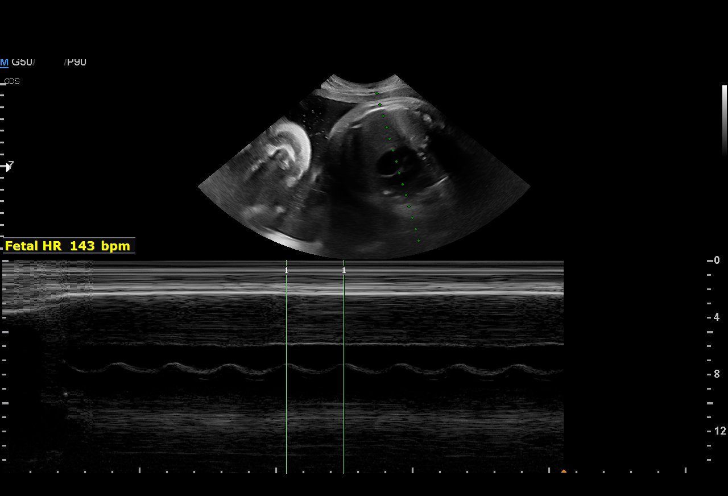
[im 9/20]
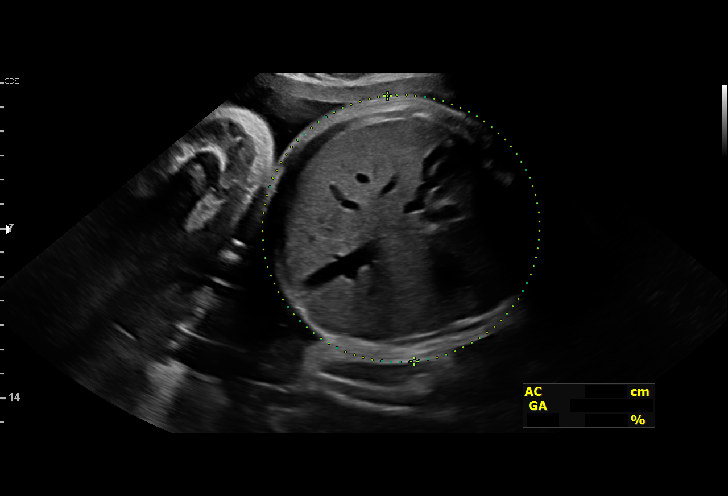
[im 11/20]
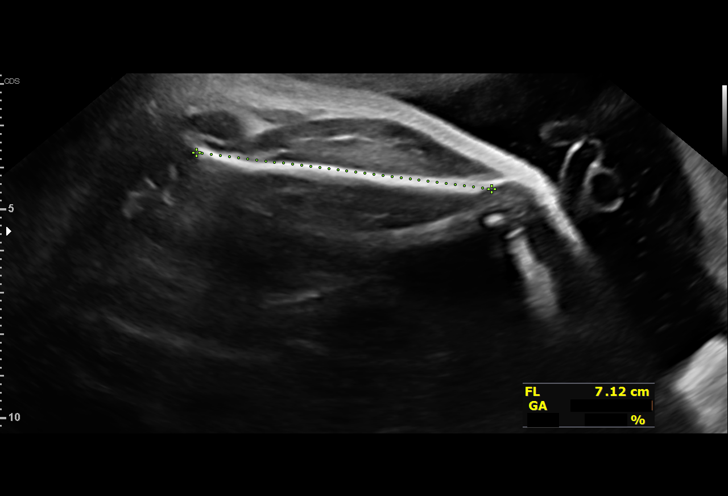
[im 12/20]
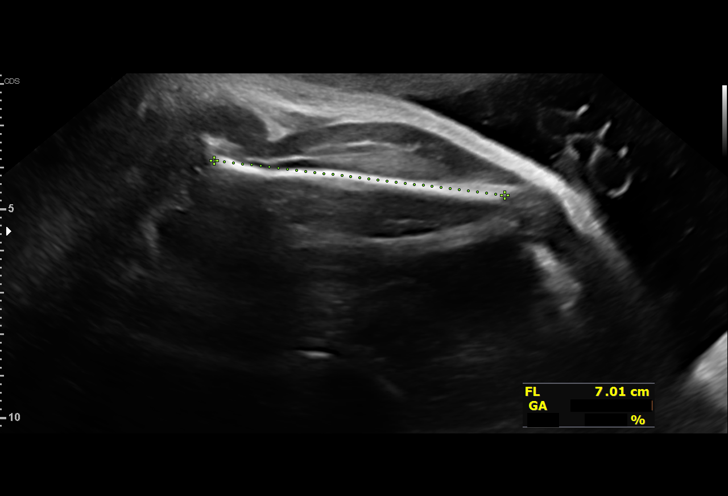
[im 14/20]
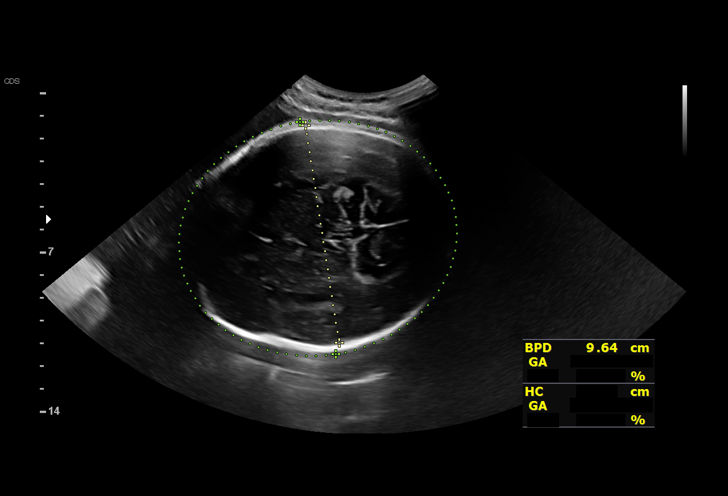
[im 15/20]
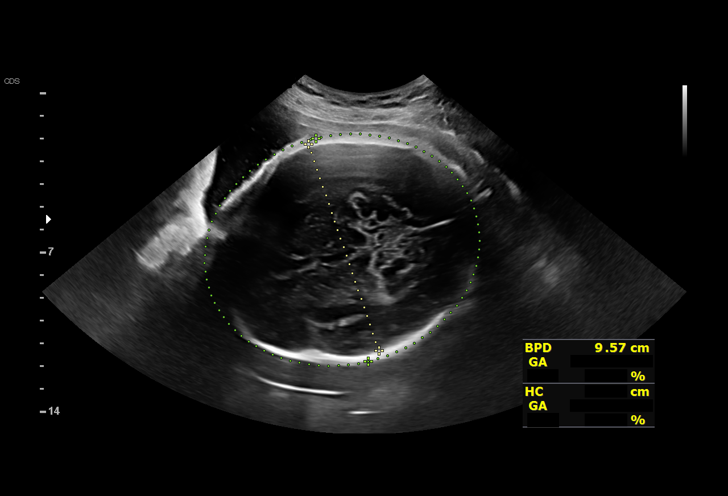
[im 17/20]
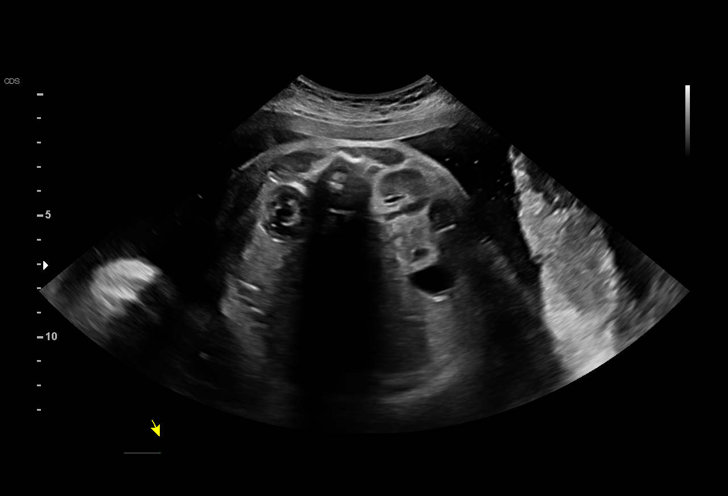
[im 18/20]
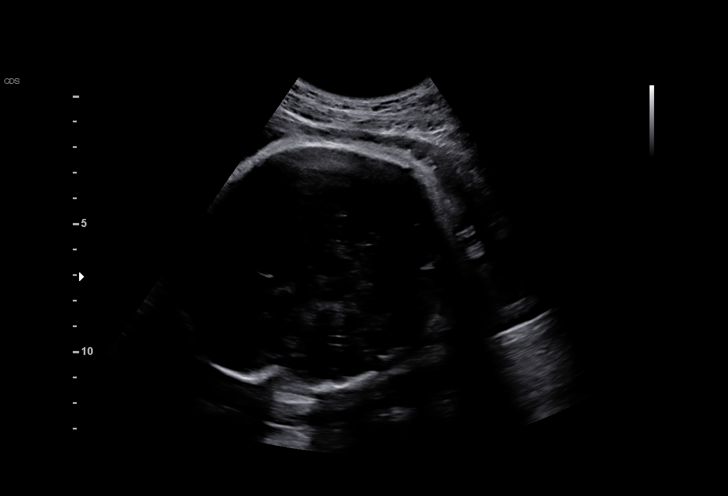
[im 20/20]
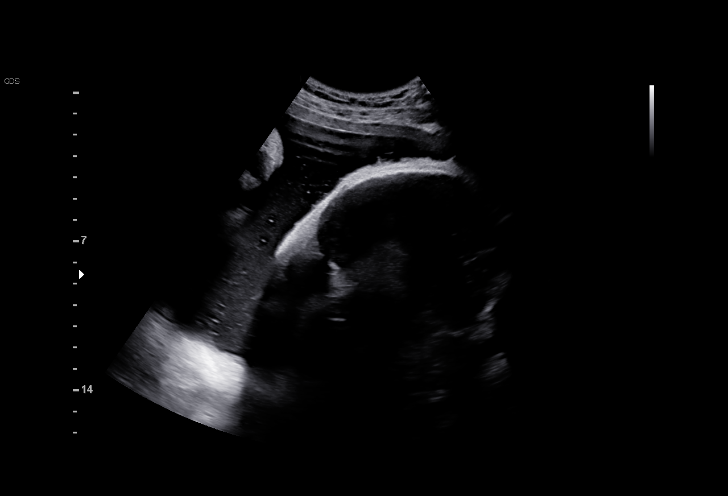

[13 of 20 positions shown; findings below may reference images not displayed]

Obstetrics &
                                                            Gynecology
                                                            [id number]
                                                            [place].

Indications

 2 vessel umbilical cord
 Polyhydramnios, third trimester, antepartum
 condition or complication, unspecified fetus
 (Resolved)
 37 weeks gestation of pregnancy
 Short interval between pregancies, 2nd
 trimester
 Encounter for other antenatal screening
 follow-up
Vital Signs

                                                Height:        5'4"
Fetal Evaluation

 Num Of Fetuses:         1
 Fetal Heart Rate(bpm):  143
 Cardiac Activity:       Observed
 Presentation:           Cephalic
 Placenta:               Posterior
 P. Cord Insertion:      Previously Visualized
 Amniotic Fluid
 AFI FV:      Within normal limits

 AFI Sum(cm)     %Tile       Largest Pocket(cm)
 23.1            90

 RUQ(cm)       RLQ(cm)       LUQ(cm)        LLQ(cm)

Biometry

 BPD:      95.6  mm     G. Age:  39w 0d         96  %    CI:        74.97   %    70 - 86
                                                         FL/HC:      20.1   %    20.8 -
 HC:      350.3  mm     G. Age:  40w 5d         95  %    HC/AC:      0.98        0.92 -
 AC:      356.4  mm     G. Age:  39w 4d         99  %    FL/BPD:     73.7   %    71 - 87
 FL:       70.5  mm     G. Age:  36w 1d         24  %    FL/AC:      19.8   %    20 - 24

 Est. FW:    5765  gm           8 lb     93  %
OB History

 Gravidity:    2         Term:   1
 Living:       1
Gestational Age

 U/S Today:     38w 6d                                        EDD:   10/09/20
 Best:          37w 1d     Det. By:  Previous Ultrasound      EDD:   10/21/20
                                     (03/25/20)
Anatomy

 Cranium:               Appears normal         Aortic Arch:            Previously seen
 Cavum:                 Previously seen        Ductal Arch:            Previously seen
 Ventricles:            Previously seen        Diaphragm:              Appears normal
 Choroid Plexus:        Previously seen        Stomach:                Appears normal, left
                                                                       sided
 Cerebellum:            Previously seen        Abdomen:                Appears normal
 Posterior Fossa:       Previously seen        Abdominal Wall:         Previously seen
 Nuchal Fold:           Previously seen        Cord Vessels:           2 Vessel Cord
 Face:                  Orbits and profile     Kidneys:                Appear normal
                        previously seen
 Lips:                  Previously seen        Bladder:                Appears normal
 Palate:                Previously seen        Spine:                  Previously seen
 Heart:                 Previously seen        Upper Extremities:      Previously seen
 RVOT:                  Previously seen        Lower Extremities:      Previously seen
 LVOT:                  Previously seen

 Other:  Other anatomy previously imaged and appeared normal.
Cervix Uterus Adnexa

 Cervix
 Not visualized (advanced GA >32wks)
Comments

 This patient was seen for a follow up growth scan due to a
 fetus with a two-vessel cord.  She denies any problems since
 her last exam.  Polyhydramnios had been noted earlier in her
 current pregnancy.  She reports that her prior child was
 delivered at term weighing about 8 pounds.
 She was informed that the fetal growth and amniotic fluid
 level appears appropriate for her gestational age.
 The previously noted polyhydramnios was not noted today.
 Due to a fetus with a two-vessel cord, delivery may occur at
 any time at 39 weeks or greater.

## 2023-01-14 ENCOUNTER — Telehealth: Payer: Self-pay

## 2023-01-14 NOTE — Telephone Encounter (Signed)
LVM for patient to call back. AS, CMA 

## 2023-10-22 DIAGNOSIS — Z0001 Encounter for general adult medical examination with abnormal findings: Secondary | ICD-10-CM | POA: Diagnosis not present

## 2023-10-22 DIAGNOSIS — N898 Other specified noninflammatory disorders of vagina: Secondary | ICD-10-CM | POA: Diagnosis not present

## 2023-10-22 DIAGNOSIS — N760 Acute vaginitis: Secondary | ICD-10-CM | POA: Diagnosis not present

## 2023-10-22 DIAGNOSIS — R87610 Atypical squamous cells of undetermined significance on cytologic smear of cervix (ASC-US): Secondary | ICD-10-CM | POA: Diagnosis not present

## 2023-10-29 DIAGNOSIS — Z3046 Encounter for surveillance of implantable subdermal contraceptive: Secondary | ICD-10-CM | POA: Diagnosis not present

## 2024-08-16 DIAGNOSIS — L739 Follicular disorder, unspecified: Secondary | ICD-10-CM | POA: Diagnosis not present

## 2024-08-16 DIAGNOSIS — L700 Acne vulgaris: Secondary | ICD-10-CM | POA: Diagnosis not present
